# Patient Record
Sex: Female | Born: 1960 | Race: White | Hispanic: No | Marital: Married | Smoking: Never smoker
Health system: Southern US, Community
[De-identification: ages and names within clinical notes are randomized; demographics above are authoritative.]

## PROBLEM LIST (undated history)

## (undated) DIAGNOSIS — M1990 Unspecified osteoarthritis, unspecified site: Secondary | ICD-10-CM

## (undated) DIAGNOSIS — G454 Transient global amnesia: Secondary | ICD-10-CM

## (undated) DIAGNOSIS — O24419 Gestational diabetes mellitus in pregnancy, unspecified control: Secondary | ICD-10-CM

## (undated) HISTORY — PX: OTHER SURGICAL HISTORY: SHX169

## (undated) HISTORY — DX: Gestational diabetes mellitus in pregnancy, unspecified control: O24.419

## (undated) HISTORY — DX: Transient global amnesia: G45.4

## (undated) HISTORY — PX: BARTHOLIN GLAND CYST EXCISION: SHX565

## (undated) HISTORY — DX: Unspecified osteoarthritis, unspecified site: M19.90

## (undated) HISTORY — PX: TONSILLECTOMY: SUR1361

---

## 1989-11-11 HISTORY — PX: DILATATION AND CURETTAGE/HYSTEROSCOPY WITH MINERVA: SHX6851

## 1996-11-11 HISTORY — PX: WISDOM TOOTH EXTRACTION: SHX21

## 1998-02-21 ENCOUNTER — Encounter: Admission: RE | Admit: 1998-02-21 | Discharge: 1998-05-22 | Payer: Self-pay | Admitting: Obstetrics and Gynecology

## 1998-04-18 ENCOUNTER — Inpatient Hospital Stay (HOSPITAL_COMMUNITY): Admission: AD | Admit: 1998-04-18 | Discharge: 1998-04-20 | Payer: Self-pay | Admitting: Obstetrics and Gynecology

## 1998-04-24 ENCOUNTER — Encounter (HOSPITAL_COMMUNITY): Admission: RE | Admit: 1998-04-24 | Discharge: 1998-07-23 | Payer: Self-pay | Admitting: Obstetrics and Gynecology

## 1999-09-21 ENCOUNTER — Other Ambulatory Visit: Admission: RE | Admit: 1999-09-21 | Discharge: 1999-09-21 | Payer: Self-pay | Admitting: Obstetrics and Gynecology

## 2001-05-05 ENCOUNTER — Other Ambulatory Visit: Admission: RE | Admit: 2001-05-05 | Discharge: 2001-05-05 | Payer: Self-pay | Admitting: Obstetrics and Gynecology

## 2003-01-05 ENCOUNTER — Other Ambulatory Visit: Admission: RE | Admit: 2003-01-05 | Discharge: 2003-01-05 | Payer: Self-pay | Admitting: Obstetrics and Gynecology

## 2003-11-10 ENCOUNTER — Ambulatory Visit (HOSPITAL_COMMUNITY): Admission: RE | Admit: 2003-11-10 | Discharge: 2003-11-10 | Payer: Self-pay | Admitting: Urology

## 2003-11-12 HISTORY — PX: INCONTINENCE SURGERY: SHX676

## 2004-04-16 ENCOUNTER — Other Ambulatory Visit: Admission: RE | Admit: 2004-04-16 | Discharge: 2004-04-16 | Payer: Self-pay | Admitting: Obstetrics and Gynecology

## 2004-12-25 ENCOUNTER — Other Ambulatory Visit: Admission: RE | Admit: 2004-12-25 | Discharge: 2004-12-25 | Payer: Self-pay | Admitting: Obstetrics and Gynecology

## 2005-09-25 ENCOUNTER — Other Ambulatory Visit: Admission: RE | Admit: 2005-09-25 | Discharge: 2005-09-25 | Payer: Self-pay | Admitting: Obstetrics and Gynecology

## 2008-10-21 ENCOUNTER — Ambulatory Visit (HOSPITAL_COMMUNITY): Admission: RE | Admit: 2008-10-21 | Discharge: 2008-10-21 | Payer: Self-pay | Admitting: Obstetrics and Gynecology

## 2008-10-21 ENCOUNTER — Encounter (INDEPENDENT_AMBULATORY_CARE_PROVIDER_SITE_OTHER): Payer: Self-pay | Admitting: Obstetrics and Gynecology

## 2011-03-26 NOTE — Op Note (Signed)
NAME:  Michelle Lynch, Michelle Lynch                ACCOUNT NO.:  [id number]   MEDICAL RECORD NO.:  [id number]          PATIENT TYPE:  AMB   LOCATION:  SDC                           FACILITY:  [facility]   PHYSICIAN:  Leighton Roach Meisinger, M.D.DATE OF BIRTH:  01-27-1961   DATE OF PROCEDURE:  DATE OF DISCHARGE:                               OPERATIVE REPORT   PREOPERATIVE AND POSTOPERATIVE DIAGNOSES:  Endometrial lesion and stress  urinary incontinence.   PROCEDURE:  Hysteroscopy with resection of endometrial lesion followed  by NovaSure endometrial ablation and Solyx suburethral sling.   SURGEON:  Zenaida Niece, MD   ANESTHESIA:  Monitored anesthesia care and local block.   FINDINGS:  She had a large endometrial lesion arising from the left mid  fundus with a wide stalk.  She had an otherwise normal endometrial  cavity.   SPECIMENS:  Endometrial resection sent for routine Pathology.   ESTIMATED BLOOD LOSS:  100 mL.   FLUID DEFICIT THROUGH THE HYSTEROSCOPE:  235 mL most of which I believe  was on the floor.   COMPLICATIONS:  None.   PROCEDURE IN DETAIL:  The patient was taken to the operating room and  placed in the dorsal supine position.  She was given IV sedation and  placed in mobile stirrups.  Perineum and vagina were then prepped and  draped in the usual sterile fashion and bladder drained with a red  Robinson catheter.  A Graves speculum was inserted into the vagina and  the anterior lip of the cervix was grasped with a single-tooth  tenaculum.  Paracervical block was then performed with a total of 16 mL  of a mixture of 0.5% Marcaine with epinephrine and 1% lidocaine.  This  was a deep paracervical block.  The uterus then sounded to 8 cm.  The  cervix gradually easily dilated to accommodate a size 19 dilator.  The  observer hysteroscope was inserted and good visualization was achieved.  The large lesion with a wide stalk was arising from the left mid fundus  in the endometrial cavity.   The observer hysteroscope was removed.  The  resectoscope was put together and flushed with sorbitol.  The  resectoscope was then inserted.  Good visualization was achieved.  The  lesion was freed from the uterine sidewall.  This was done using a  single loop electrode with cutting current.  Once it was freed, the  hysteroscope was removed.  Small ring forceps were eventually used to  reach in and grab the polyp and remove it mostly intact.  Other small  pieces were grasped with polyp forceps.  The resectoscope was again  inserted and most of the lesion was found to be gone.  A small amount of  bumpiness on the endometrium was resected and the resectoscope was then  removed.  The observer hysteroscope was again put back together and  reinserted.  This was done after it was flushed with lactated Ringer's  and the uterus was flushed with lactated Ringer's for approximately 2  minutes.  The cavity was normal with no lesions and no bleeding.  The  hysteroscope was then removed.  The cervix then measured 3 cm with a  size 7 Hegar dilator.  The cervix dilated to a size 7 and then size 8  Hegar dilator.  The NovaSure device was then inserted and appropriately  deployed.  The device used a length of 5 cm, a width of 4.2 cm, and used  116 watts for 50 seconds.  Endometrial ablation was performed without  complications after the CO2 test was passed.  The NovaSure device was  then removed and found to be intact.  The single-tooth tenaculum was  removed and bleeding controlled with pressure.  Graves speculum was then  removed from the vagina.   Attention was turned to the sling.  A small weighted speculum was placed  into the vagina.  The anterior vagina was grasped with Allis clamps at a  distance approximately one fingerbreadth from the urethral meatus.  The  same local anesthetic was infiltrated in the midline and laterally for  hydrodissection.  A 1-cm vertical incision was then made between  the  Allis clamps.  The edges were grasped with Allis clamps and Metzenbaum  scissors were used to dissect laterally to the level of the pubic ramus  on both sides.  The Solyx sling was then put together and inserted first  on the patient's right side just past the pubic ramus into the obturator  muscle.  It was released on the right side and then placed on the left  side and left intact.  It appeared to be snug up against the urethra.  I  was able to pass a hemostat between the sling and the urethral tissue,  but it was snug.  Cystoscopy was then performed and revealed a normal  bladder and no evidence of sling in the bladder or the urethra.  The  cystoscope was then removed.  A Crede maneuver was performed and no  urine leakage was observed.  The sling was again inspected and found to  be in appropriate position.  The sling was released on the patient's  left side.  No evidence of buttonholing in the vagina was evident.  The  incision was then closed with running locking 2-0 Vicryl.  This achieved  adequate closure and adequate hemostasis.  All instruments were then  removed from the vagina.  The bladder was drained of all but  approximately 100 mL of fluid.  The patient was then taken down from  stirrups.  She tolerated the procedure well and was taken to the  recovery room in stable condition.  Counts were correct, she had PAS  hose on throughout the procedure and received Ancef 1 g IV at the  beginning of the procedure.      Zenaida Niece, M.D.  Electronically Signed     TDM/MEDQ  D:  10/21/2008  T:  10/22/2008  Job:  [id number]

## 2011-08-16 LAB — PREGNANCY, URINE: Preg Test, Ur: NEGATIVE

## 2011-08-16 LAB — CBC
HCT: 37.7 % (ref 36.0–46.0)
Hemoglobin: 12.5 g/dL (ref 12.0–15.0)
MCHC: 33.2 g/dL (ref 30.0–36.0)
MCV: 92.9 fL (ref 78.0–100.0)
Platelets: 264 10*3/uL (ref 150–400)
RBC: 4.05 MIL/uL (ref 3.87–5.11)
RDW: 13.9 % (ref 11.5–15.5)
WBC: 5.3 10*3/uL (ref 4.0–10.5)

## 2016-12-09 DIAGNOSIS — Z1231 Encounter for screening mammogram for malignant neoplasm of breast: Secondary | ICD-10-CM | POA: Diagnosis not present

## 2017-06-04 ENCOUNTER — Other Ambulatory Visit (HOSPITAL_COMMUNITY): Payer: Self-pay

## 2017-06-04 ENCOUNTER — Observation Stay (HOSPITAL_COMMUNITY): Payer: BLUE CROSS/BLUE SHIELD

## 2017-06-04 ENCOUNTER — Observation Stay (HOSPITAL_COMMUNITY)
Admission: EM | Admit: 2017-06-04 | Discharge: 2017-06-05 | Disposition: A | Discharge status: Home / Self Care | Payer: BLUE CROSS/BLUE SHIELD | Attending: Family Medicine | Admitting: Family Medicine

## 2017-06-04 ENCOUNTER — Encounter (HOSPITAL_COMMUNITY): Payer: Self-pay

## 2017-06-04 ENCOUNTER — Emergency Department (HOSPITAL_COMMUNITY): Payer: BLUE CROSS/BLUE SHIELD

## 2017-06-04 ENCOUNTER — Other Ambulatory Visit: Payer: Self-pay

## 2017-06-04 DIAGNOSIS — E663 Overweight: Secondary | ICD-10-CM | POA: Diagnosis not present

## 2017-06-04 DIAGNOSIS — K0381 Cracked tooth: Secondary | ICD-10-CM | POA: Diagnosis not present

## 2017-06-04 DIAGNOSIS — K0889 Other specified disorders of teeth and supporting structures: Secondary | ICD-10-CM

## 2017-06-04 DIAGNOSIS — F411 Generalized anxiety disorder: Secondary | ICD-10-CM | POA: Insufficient documentation

## 2017-06-04 DIAGNOSIS — R29818 Other symptoms and signs involving the nervous system: Secondary | ICD-10-CM | POA: Diagnosis not present

## 2017-06-04 DIAGNOSIS — F430 Acute stress reaction: Secondary | ICD-10-CM | POA: Diagnosis not present

## 2017-06-04 DIAGNOSIS — Z634 Disappearance and death of family member: Secondary | ICD-10-CM | POA: Insufficient documentation

## 2017-06-04 DIAGNOSIS — G454 Transient global amnesia: Principal | ICD-10-CM | POA: Insufficient documentation

## 2017-06-04 DIAGNOSIS — R410 Disorientation, unspecified: Secondary | ICD-10-CM | POA: Diagnosis not present

## 2017-06-04 LAB — I-STAT CHEM 8, ED
BUN: 9 mg/dL (ref 6–20)
CALCIUM ION: 1.19 mmol/L (ref 1.15–1.40)
CHLORIDE: 105 mmol/L (ref 101–111)
CREATININE: 0.6 mg/dL (ref 0.44–1.00)
GLUCOSE: 102 mg/dL — AB (ref 65–99)
HCT: 40 % (ref 36.0–46.0)
Hemoglobin: 13.6 g/dL (ref 12.0–15.0)
POTASSIUM: 3.4 mmol/L — AB (ref 3.5–5.1)
Sodium: 143 mmol/L (ref 135–145)
TCO2: 26 mmol/L (ref 0–100)

## 2017-06-04 LAB — DIFFERENTIAL
Basophils Absolute: 0 10*3/uL (ref 0.0–0.1)
Basophils Relative: 0 %
Eosinophils Absolute: 0.1 10*3/uL (ref 0.0–0.7)
Eosinophils Relative: 1 %
Lymphocytes Relative: 38 %
Lymphs Abs: 2.3 10*3/uL (ref 0.7–4.0)
Monocytes Absolute: 0.3 10*3/uL (ref 0.1–1.0)
Monocytes Relative: 5 %
Neutro Abs: 3.4 10*3/uL (ref 1.7–7.7)
Neutrophils Relative %: 56 %

## 2017-06-04 LAB — COMPREHENSIVE METABOLIC PANEL
ALT: 11 U/L — ABNORMAL LOW (ref 14–54)
AST: 23 U/L (ref 15–41)
Albumin: 4.4 g/dL (ref 3.5–5.0)
Alkaline Phosphatase: 75 U/L (ref 38–126)
Anion gap: 9 (ref 5–15)
BUN: 9 mg/dL (ref 6–20)
CO2: 26 mmol/L (ref 22–32)
Calcium: 9.4 mg/dL (ref 8.9–10.3)
Chloride: 106 mmol/L (ref 101–111)
Creatinine, Ser: 0.68 mg/dL (ref 0.44–1.00)
GFR calc Af Amer: >60 mL/min (ref >60)
GFR calc non Af Amer: >60 mL/min (ref >60)
Glucose, Bld: 104 mg/dL — ABNORMAL HIGH (ref 65–99)
Potassium: 3.4 mmol/L — ABNORMAL LOW (ref 3.5–5.1)
Sodium: 141 mmol/L (ref 135–145)
Total Bilirubin: 0.6 mg/dL (ref 0.3–1.2)
Total Protein: 7.2 g/dL (ref 6.5–8.1)

## 2017-06-04 LAB — PROTIME-INR
INR: 0.88
Prothrombin Time: 11.9 seconds (ref 11.4–15.2)

## 2017-06-04 LAB — APTT: aPTT: 27 seconds (ref 24–36)

## 2017-06-04 LAB — CBC
HCT: 40 % (ref 36.0–46.0)
Hemoglobin: 13.1 g/dL (ref 12.0–15.0)
MCH: 29.6 pg (ref 26.0–34.0)
MCHC: 32.8 g/dL (ref 30.0–36.0)
MCV: 90.5 fL (ref 78.0–100.0)
Platelets: 255 10*3/uL (ref 150–400)
RBC: 4.42 MIL/uL (ref 3.87–5.11)
RDW: 14.2 % (ref 11.5–15.5)
WBC: 6.1 10*3/uL (ref 4.0–10.5)

## 2017-06-04 LAB — I-STAT TROPONIN, ED: Troponin i, poc: 0 ng/mL (ref 0.00–0.08)

## 2017-06-04 MED ORDER — ACETAMINOPHEN 325 MG PO TABS
650.0000 mg | ORAL_TABLET | Freq: Four times a day (QID) | ORAL | Status: DC | PRN
  Administered 2017-06-05 (×2): 650 mg via ORAL
  Filled 2017-06-04 (×2): qty 2

## 2017-06-04 MED ORDER — ACETAMINOPHEN 650 MG RE SUPP: 650.0000 mg | Freq: Four times a day (QID) | RECTAL | Status: DC | PRN

## 2017-06-04 MED ORDER — THIAMINE HCL 100 MG/ML IJ SOLN
100.0000 mg | Freq: Every day | INTRAMUSCULAR | Status: DC
  Filled 2017-06-04: qty 2

## 2017-06-04 MED ORDER — TRAMADOL HCL 50 MG PO TABS
50.0000 mg | ORAL_TABLET | Freq: Four times a day (QID) | ORAL | Status: DC | PRN
  Administered 2017-06-04 – 2017-06-05 (×2): 50 mg via ORAL
  Filled 2017-06-04 (×2): qty 1

## 2017-06-04 MED ORDER — ACETAMINOPHEN 325 MG PO TABS
650.0000 mg | ORAL_TABLET | Freq: Once | ORAL | Status: AC
  Administered 2017-06-04: 650 mg via ORAL
  Filled 2017-06-04: qty 2

## 2017-06-04 MED ORDER — ENOXAPARIN SODIUM 40 MG/0.4ML ~~LOC~~ SOLN
40.0000 mg | SUBCUTANEOUS | Status: DC
  Filled 2017-06-04: qty 0.4

## 2017-06-04 NOTE — Consult Note (Signed)
Neurology Consultation Reason for Consult: Acute code stroke Referring Physician: Dr. Clarice PolePfeifer  CC: Confusion  History is obtained from: Patient, chart, nurses and patient's 2 daughters at bedside  HPI: Michelle Lynch is a 56 y.o. female who has no known past medical history who was brought into the hospital when her daughters found her to be confused-reported as asking the same questions over and over again and not remembering what happened during the day. This early afternoon, the patient had gone to a dentist for an appointment, had been told that she will need to come back for another appointment for an implant, no procedure was done no anesthetic was used. Patient came back home and could not remember what had happened. She kept on saying my teeth hurt and was disoriented to place and time. She said it seems like she is in a dream and does not know where she is and what is going on. She kept asking, if this could be a panic attack. She denied any history of panic attacks in the past. According to the daughters, she has multiple stressors, mainly monetary but has never had these kind of episodes before Denied any knowledge of drug use. Complains of mild headache now. No h/o migraines in the past.   LKW: 2:30 PM tpa given?: no, NIH 0. 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    History reviewed. No pertinent past medical history.  No family history on file. Has a history of stroke in mother.   Social History:  reports that she has never smoked. She has never used smokeless tobacco. She reports that she does not drink alcohol or use drugs.   Exam: Current vital signs: BP (!) 156/74   Pulse 75   Temp 98.6 F (37 C) (Oral)   Resp 18   Wt 77.1 kg (170 lb)   SpO2 100%  Vital signs in last 24 hours: Temp:  [98.6 F (37 C)] 98.6 F (37 C) (07/25 1758) Pulse Rate:  [75] 75 (07/25  1758) Resp:  [18] 18 (07/25 1758) BP: (156)/(74) 156/74 (07/25 1758) SpO2:  [100 %] 100 % (07/25 1758) Weight:  [77.1 kg (170 lb)] 77.1 kg (170 lb) (07/25 1759)   Physical Exam  Constitutional: Appears well-developed and well-nourished.  Psych: Affect appropriate to situation Eyes: No scleral injection HENT: No OP obstrucion Head: Normocephalic.  Cardiovascular: Normal rate and regular rhythm.  Respiratory: Effort normal and breath sounds normal to anterior ascultation GI: Soft.  No distension. There is no tenderness.  Skin: WDI  Neuro: Mental Status: Patient is awake, alert, oriented to person and place. Not oriented to date or month. Perseverating and repeating the same questions over and over again-"can somebody fill me in on on what's going on, my teeth hurt" She is able to provide a reasonably clear history of events before this afternoon but does not remember much from this afternoon. Naming comprehension and repetition intact. Speech is not dysarthric Cranial Nerves: II: Visual Fields are full. Pupils are equal, round, and reactive to light.  III,IV, VI: EOMI without ptosis or diploplia.  V: Facial sensation is symmetric to temperature VII: Facial movement is symmetric.  VIII: hearing is intact to voice X: Uvula elevates symmetrically XI: Shoulder shrug is symmetric. XII: tongue is midline without atrophy or fasciculations.  Motor: Tone is normal. Bulk is normal. 5/5 strength was present in  all four extremities. Sensory: Sensation is symmetric to light touch and temperature in the arms and legs Deep Tendon Reflexes: 2+ and symmetric in the biceps and patellae Plantars: Toes are downgoing bilaterally. Cerebellar: FNF and HKS are intact bilaterall   I have reviewed labs in epic and the results pertinent to this consultation are: CBC    Component Value Date/Time   WBC 6.1 06/04/2017 1758   RBC 4.42 06/04/2017 1758   HGB 13.6 06/04/2017 1821   HCT 40.0 06/04/2017  1821   PLT 255 06/04/2017 1758   MCV 90.5 06/04/2017 1758   MCH 29.6 06/04/2017 1758   MCHC 32.8 06/04/2017 1758   RDW 14.2 06/04/2017 1758   LYMPHSABS 2.3 06/04/2017 1758   MONOABS 0.3 06/04/2017 1758   EOSABS 0.1 06/04/2017 1758   BASOSABS 0.0 06/04/2017 1758   CMP     Component Value Date/Time   NA 143 06/04/2017 1821   K 3.4 (L) 06/04/2017 1821   CL 105 06/04/2017 1821   GLUCOSE 102 (H) 06/04/2017 1821   BUN 9 06/04/2017 1821   CREATININE 0.60 06/04/2017 1821    Imaging I have reviewed the images obtained: Noncontrast CT of the head shows no acute changes. No bleed. No signs of evolving ischemic changes.  Impression: 56 year old with no past medical history with sudden onset of what was described as confusion that included not being aware of where she was and what was going on at the moment. She had lost recollection of events before this afternoon. According to the daughters, she had to undergo a root canal treatment, which was canceled and she was scheduled for an implant which is a very expensive procedures and she has been stressed out about money for a while. She herself asked a number of times if she was having a panic attack. She has no focal neurological deficits in terms of her cranial nerves, motor function sensory function coordination or gait.  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 I would like to obtain some imaging to ensure that there is no stroke.  Differentials in order of likelihood: -Transient global amnesia (TGA) -Panic attack -r/o stroke -very unlikely seizure -unlikely complicated migraine  Recommendations -MRI of the brain without contrast -Admit for observation overnight. If imaging is normal, can be discharged home from a neurological standpoint tomorrow. -Given the odd presentation as well as slightly younger age for a TGA, I would also recommend obtaining a urine drug screen. -Outpatient EEG -Outpatient neurology follow-up  The  plan was discussed with the daughters at bedside and the patient. We will follow in the morning  -- Milon DikesAshish Ailani Governale, MD [facility] [phone]  If 7pm to 7am, please call on call as listed on AMION.

## 2017-06-04 NOTE — ED Provider Notes (Signed)
[facility]-EMERGENCY DEPT Provider Note   CSN: [id number] Arrival date & time: 06/04/17  1740   An emergency department physician performed an initial assessment on this suspected stroke patient at 1814.  History   Chief Complaint Chief Complaint  Patient presents with  . Code Stroke    HPI Michelle Lynch is a 56 y.o. female.  HPI   Patient is a 56 year old female who went to the dentist today. She got some bad news about her teeth that one would have to be pulled and she have to get implant. She returned home and could not remember who she was where she had gone that day or needing that happened. Patient continues to ask the same question over and over again. Patient initially called a code stroke.  History reviewed. No pertinent past medical history.  There are no active problems to display for this patient.   History reviewed. No pertinent surgical history.  OB History    No data available       Home Medications    Prior to Admission medications   Not on File    Family History No family history on file.  Social History Social History  Substance Use Topics  . Smoking status: Never Smoker  . Smokeless tobacco: Never Used  . Alcohol use No     Allergies   Patient has no known allergies.   Review of Systems Review of Systems  HENT: Positive for dental problem.   Psychiatric/Behavioral: Positive for confusion. The patient is nervous/anxious.      Physical Exam Updated Vital Signs BP (!) 148/66   Pulse 72   Temp 98.6 F (37 C) (Oral)   Resp 20   Wt 77.1 kg (170 lb)   SpO2 100%   Physical Exam  Constitutional: She appears well-developed and well-nourished.  HENT:  Head: Normocephalic and atraumatic.  Eyes: Right eye exhibits no discharge.  Cardiovascular: Normal rate, regular rhythm and normal heart sounds.   No murmur heard. Pulmonary/Chest: Effort normal and breath sounds normal. She has no wheezes. She has no rales.  Abdominal: Soft. She  exhibits no distension. There is no tenderness.  Neurological: No cranial nerve deficit.  Patient continues ask the same question over and over again. No focal neuro deficits.  Skin: Skin is warm and dry. She is not diaphoretic.  Psychiatric: She has a normal mood and affect.  Nursing note and vitals reviewed.    ED Treatments / Results  Labs (all labs ordered are listed, but only abnormal results are displayed) Labs Reviewed  I-STAT CHEM 8, ED - Abnormal; Notable for the following:       Result Value   Potassium 3.4 (*)    Glucose, Bld 102 (*)    All other components within normal limits  CBC  PROTIME-INR  APTT  DIFFERENTIAL  COMPREHENSIVE METABOLIC PANEL  I-STAT TROPONIN, ED  CBG MONITORING, ED  CBG MONITORING, ED    EKG  EKG Interpretation None       Radiology Ct Head Code Stroke W/o Cm  Result Date: 06/04/2017 CLINICAL DATA:  Code stroke. Initial evaluation for acute confusion, jaw pain. EXAM: CT HEAD WITHOUT CONTRAST TECHNIQUE: Contiguous axial images were obtained from the base of the skull through the vertex without intravenous contrast. COMPARISON:  None. FINDINGS: Brain: Cerebral volume within normal limits. No acute intracranial hemorrhage. No evidence for acute large vessel territory infarct. No mass lesion, midline shift or mass effect. No hydrocephalus. No extra-axial fluid collection. Probable small choroidal  fissure cyst noted on the right. Vascular: No hyperdense vessel. Skull: Scalp soft tissues and calvarium within normal limits. Sinuses/Orbits: Globes and orbital soft tissues within normal limits. Minimal layering opacity within the right maxillary sinus. Paranasal sinuses are otherwise clear. No mastoid effusion. The ASPECTS [facility]([place] Stroke Program Early CT Score) - Ganglionic level infarction (caudate, lentiform nuclei, internal capsule, insula, M1-M3 cortex): 7 - Supraganglionic infarction (M4-M6 cortex): 3 Total score (0-10 with 10 being normal): 10  IMPRESSION: 1. Negative head CT.  No acute intracranial process identified. 2. ASPECTS is 10 Critical Value/emergent results were called by telephone at the time of interpretation on 06/04/2017 at 6:35 pm to Dr. Wilford CornerArora , who verbally acknowledged these results. Electronically Signed   By: Rise MuBenjamin  McClintock M.D.   On: 06/04/2017 18:37    Procedures Procedures (including critical care time)  Medications Ordered in ED Medications - No data to display   Initial Impression / Assessment and Plan / ED Course  I have reviewed the triage vital signs and the nursing notes.  Pertinent labs & imaging results that were available during my care of the patient were reviewed by me and considered in my medical decision making (see chart for details).     Patient is a 56 year old female who went to the dentist today. She got some bad news about her teeth that one would have to be pulled and she have to get implant. She returned home and could not remember who she was where she had gone that day or needing that happened. Patient continues to ask the same question over and over again. Patient initially called a code stroke.  Classic symptoms of a TGA. Neuro seen and agrees. Will admit to medicine for overnight observation, MRI.  Final Clinical Impressions(s) / ED Diagnoses   Final diagnoses:  None    New Prescriptions New Prescriptions   No medications on file     Abelino DerrickMackuen, [place] Figg Lyn, MD 06/04/17 1902

## 2017-06-04 NOTE — H&P (Signed)
Family Medicine Teaching [facility] Admission History and Physical Service Pager: [phone]  Patient name: Michelle Lynch Medical record number: [id number] Date of birth: July 31, 1961 Age: 56 y.o. Gender: female  Primary Care Provider: Patient, No Pcp Per Consultants: Neurology Code Status: Full  Chief Complaint: memory loss  Assessment and Plan: Michelle Lynch is a 56 y.o. female presenting with new onset amnesia. No significant PMH.   #Confusion: New onset amnesia that began this afternoon. Neurology consulting and suspects transient global amnesia. Patient's younger age less typical, but her prominent anterograde and limited retrograde amnesia are very consistent with TGA. Possible triggers include pain of broken tooth and stress over being told she needs to have an expensive dental procedure in setting of financial concerns. No evidence of trauma on exam. CT head negative. Continues to know biographical information, which would make psychogenic amnesia less likely. Negative history for previous stroke, HTN, smoking and no focal deficits making acute stroke less likely, though could have hippocampal lesion that could explain symptoms. Normal EKG. No leukocytosis. No electrolyte derangements on BMP. Denies substance abuse. Patient reports remote history of 2 previous migraines. Does not recall having extreme stress responses in past requiring medical treatment, nor do her daughters. No personal or family history of epilepsy.  - Admit for observation, attending Dr. Lum Babe - Neurology consulting, appreciate recommendations: Obtain MRI brain w/o contrast; UDS; Outpatient EEG and Neurology follow-up - Administer IV thiamine 100 mg in case of Wernicke's encephalopathy - Q4 hour Neuro checks - Would obtain stroke risk factor labs if MRI shows evidence of acute abnormality - Tylenol prn for pain; tramadol 50 mg prn for more severe pain  FEN/GI: Regular diet Prophylaxis: Lovenox  Disposition:  Home pending clinical improvement and further neuroimaging  History of Present Illness:  Michelle Lynch is a 56 y.o. female presenting with new onset confusion, specifically memory loss. She recalls past events up until a couple days ago but now repeats herself and remembers few details from today. Her daughters report she went to check on a rental property this morning then was seen by her dentist. She was told she has a cracked tooth and would need to have a root canal and went to see an Endodontist around 3 PM. No procedure was performed and no medications given; she was scheduled to be seen tomorrow for procedure. She drove home and texted one of her daughters around 4:50 pm, saying she needed her help. Daughter says she was not oriented to time or place and kept talking about her teeth hurting. Patient remembers visiting her niece and her new baby on Sunday. She does not remember events from Tuesday or today but has started to repeat a few things about the dentist, which daughter attributes to them having reminded her so many times. Stressors include a problem with one of patient's rental properties and news that patient needs a dental procedure. Patient currently complaining of dental pain and saying that she must have had an acute stress reaction. She repeats these things every few minutes.   Code stroke was initially called in the ED. CT head was negative for acute intracranial abnormality.  Review Of Systems: Per HPI with the following additions:   Review of Systems  Constitutional: Negative for chills, fever and weight loss.  HENT: Negative for congestion and tinnitus.   Eyes: Negative for blurred vision and double vision.  Respiratory: Negative for cough and shortness of breath.   Cardiovascular: Negative for chest pain and palpitations.  Gastrointestinal: Negative for nausea and vomiting.  Genitourinary: Negative for dysuria and frequency.  Musculoskeletal: Negative for falls and neck  pain.  Skin: Negative for itching and rash.  Neurological: Negative for speech change, weakness and headaches.  Psychiatric/Behavioral: Positive for memory loss. The patient is nervous/anxious.     Past Medical History: History reviewed. No pertinent past medical history.  Had gestational diabetes. Takes no regular medication.   Past Surgical History: History reviewed. No pertinent surgical history.  Had a D&C in past for miscarriage. Had endometrial ablation and suburethral ring placed for endometrial lesion and stress urinary incontinence in 2012. Had tonsillectomy when she was 5.  Social History: Social History  Substance Use Topics  . Smoking status: Never Smoker  . Smokeless tobacco: Never Used  . Alcohol use No   Additional social history: Lives with her husband and 2 daughters. Occasionally drinks. No drug or tobacco abuse. Manages rental properties.  Please also refer to relevant sections of EMR.  Family History: No family history on file. Mother had T2DM and stroke.   Allergies and Medications: No Known Allergies  Objective: BP 126/71   Pulse 68   Temp 98.6 F (37 C)   Resp 14   Wt 170 lb (77.1 kg)   SpO2 98%  Exam: General: Alert, overweight female in NAD Eyes: PERRL, EOMI ENTM: MMM, normal external ears Neck: Supple, FROM Cardiovascular: RRR, S1, S2, no mrg Respiratory: CTAB, no increased WOB. Gastrointestinal: +BS, soft, NT, ND MSK: Strength 5/5 of upper and lower extremities, including grip strength. Derm: No rashes noted. Thicker arm across forearms.  Neuro: CNII-XII. Speech fluent. Oriented to self. Knew month and year but not day of week.  Psych: Somewhat anxious. Perseverating on having to have dental procedure.   Labs and Imaging: CBC BMET   Recent Labs Lab 06/04/17 1758 06/04/17 1821  WBC 6.1  --   HGB 13.1 13.6  HCT 40.0 40.0  PLT 255  --     Recent Labs Lab 06/04/17 1758 06/04/17 1821  NA 141 143  K 3.4* 3.4*  CL 106 105   CO2 26  --   BUN 9 9  CREATININE 0.68 0.60  GLUCOSE 104* 102*  CALCIUM 9.4  --      Ct Head Code Stroke W/o Cm  Result Date: 06/04/2017 CLINICAL DATA:  Code stroke. Initial evaluation for acute confusion, jaw pain. EXAM: CT HEAD WITHOUT CONTRAST TECHNIQUE: Contiguous axial images were obtained from the base of the skull through the vertex without intravenous contrast. COMPARISON:  None. FINDINGS: Brain: Cerebral volume within normal limits. No acute intracranial hemorrhage. No evidence for acute large vessel territory infarct. No mass lesion, midline shift or mass effect. No hydrocephalus. No extra-axial fluid collection. Probable small choroidal fissure cyst noted on the right. Vascular: No hyperdense vessel. Skull: Scalp soft tissues and calvarium within normal limits. Sinuses/Orbits: Globes and orbital soft tissues within normal limits. Minimal layering opacity within the right maxillary sinus. Paranasal sinuses are otherwise clear. No mastoid effusion. The ASPECTS [facility](Alberta Stroke Program Early CT Score) - Ganglionic level infarction (caudate, lentiform nuclei, internal capsule, insula, M1-M3 cortex): 7 - Supraganglionic infarction (M4-M6 cortex): 3 Total score (0-10 with 10 being normal): 10 IMPRESSION: 1. Negative head CT.  No acute intracranial process identified. 2. ASPECTS is 10 Critical Value/emergent results were called by telephone at the time of interpretation on 06/04/2017 at 6:35 pm to Dr. Wilford CornerArora , who verbally acknowledged these results. Electronically Signed   By: Rise MuBenjamin  McClintock  M.D.   On: 06/04/2017 18:37    Casey BurkittFitzgerald, Quamir Willemsen Moen, MD 06/04/2017, 8:28 PM PGY-3, [facility] FPTS Intern pager: [phone], text pages welcome

## 2017-06-04 NOTE — Progress Notes (Signed)
Family Medicine Teaching Service Daily Progress Note Intern Pager: [phone]  Patient name: Michelle Lynch Medical record number: [id number] Date of birth: 10/27/1961 Age: 56 y.o. Gender: female  Primary Care Provider: Patient, No Pcp Per Consultants: Neurology  Code Status: Full   Pt Overview and Major Events to Date:  Michelle Lynch is a 56 y.o. female presenting with new onset amnesia.   Assessment and Plan: Michelle Lynch is a 56 y.o. female presenting with new onset amnesia. No significant PMH.   Confusion:  New onset amnesia that began yesterday afternoon. 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 Have recommended MRI with out contrast, UDS, and outpatient EEG and follow up. Patient's younger age less typical, but her prominent anterograde and limited retrograde amnesia are very consistent with TGA. Possible triggers include pain of broken tooth and stress over being told she needs to have an expensive dental procedure in setting of financial concerns. Patient appears to have generalized anxiety on exam. No evidence of trauma on exam. CT head negative. Continues to know biographical information, which would make psychogenic amnesia less likely. Negative history for previous stroke, HTN, smoking and no focal deficits making acute stroke less likely, though could have hippocampal lesion that could explain symptoms. EKG showing normal sinus rhythm. Unlikely to be due to seizure, but will plan for outpatient EEG with neurology. Normal EKG. No leukocytosis. No electrolyte derangements on BMP. Denies substance abuse. Patient reports remote history of 2 previous migraines. Does not recall having extreme stress responses in past requiring medical treatment, nor do her daughters. No personal or family history of epilepsy. Patient refused MRI while in ED, but is agreable today. UDS negative. Patient states she is improving and memory is coming back. States she remembers her morning  routine prior to dental appointment but does not remember actual appointment, states she may have blocked it out as a traumatic event. MRI showing asymmetric prominence of right choroidal fissure without definite features of mesial temporal sclerosis.  - Neurology consulting, appreciate recommendations - EEG - Administer IV thiamine 100 mg in case of Wernicke's encephalopathy - Q4 hour Neuro checks - Would obtain stroke risk factor labs if MRI shows evidence of acute abnormality - Tylenol prn for pain; tramadol 50 mg prn for more severe pain - consider outpatient SSRI for anxiety   FEN/GI: Regular diet  PPx: Lovenox   Disposition: home  Subjective:  Patient today states she is improved and memory is coming back. Patient remembers morning routine and getting ready for dental appointment, but does not remember appointment. Patient states she blocked it out as a "traumatic event" and is very anxious about dental visit. Patient was very tearful on exam and states hospitalization is bringing back memories of her mother, who recently passed away from a stroke. Patient states she has been under increased stress due to mother's death, and other financial burdens. Patient denies any type of physical exertion or valsalva prior to amnesia episode.   Objective: Temp:  [97.9 F (36.6 C)-98.6 F (37 C)] 98.1 F (36.7 C) (07/26 1009) Pulse Rate:  [66-76] 66 (07/26 1009) Resp:  [14-25] 19 (07/26 1009) BP: (105-156)/(53-78) 105/59 (07/26 1009) SpO2:  [97 %-100 %] 97 % (07/26 1009) Weight:  [170 lb (77.1 kg)-179 lb 12.8 oz (81.6 kg)] 179 lb 12.8 oz (81.6 kg) (07/25 2318) Physical Exam: General: awake and alert, anxious, sitting up in bed watching tv Neuro: unable to complete due to patient becoming tearful and not wanting to  finish exam, sensation intact bilaterally Eyes: EOMI Cardiovascular: RRR, no MRG Respiratory: CTAB, no wheezes, rales, or rhonchi  Abdomen: soft, non tender, non  distended Extremities: no edema, non tender  Laboratory:  Recent Labs Lab 06/04/17 1758 06/04/17 1821 06/05/17 0419  WBC 6.1  --  6.0  HGB 13.1 13.6 12.4  HCT 40.0 40.0 37.5  PLT 255  --  240    Recent Labs Lab 06/04/17 1758 06/04/17 1821 06/05/17 0419  NA 141 143 139  K 3.4* 3.4* 3.7  CL 106 105 105  CO2 26  --  27  BUN 9 9 7   CREATININE 0.68 0.60 0.66  CALCIUM 9.4  --  9.0  PROT 7.2  --   --   BILITOT 0.6  --   --   ALKPHOS 75  --   --   ALT 11*  --   --   AST 23  --   --   GLUCOSE 104* 102* 104*   Drugs of Abuse     Component Value Date/Time   LABOPIA NONE DETECTED 06/04/2017 2353   COCAINSCRNUR NONE DETECTED 06/04/2017 2353   LABBENZ NONE DETECTED 06/04/2017 2353   AMPHETMU NONE DETECTED 06/04/2017 2353   THCU NONE DETECTED 06/04/2017 2353   LABBARB NONE DETECTED 06/04/2017 2353      Imaging/Diagnostic Tests: Mr Brain Wo Contrast  Result Date: 06/05/2017 CLINICAL DATA:  Confusion.  Transient global amnesia. EXAM: MRI HEAD WITHOUT CONTRAST TECHNIQUE: Multiplanar, multiecho pulse sequences of the brain and surrounding structures were obtained without intravenous contrast. COMPARISON:  CT head 06/04/2017. FINDINGS: Brain: No evidence for acute stroke, hemorrhage, hydrocephalus, mass lesion, or extra-axial fluid. There is asymmetric prominence of the RIGHT choroidal fissure, without definite features of mesial temporal sclerosis. There may be mild hippocampal atrophy, but there is no increased T2 signal in the temporal lobe. Vascular: Normal flow voids. Skull and upper cervical spine: Normal marrow signal. Sinuses/Orbits: Negative. Other: None. IMPRESSION: Asymmetric prominence of the RIGHT choroidal fissure without definite features of mesial temporal sclerosis. Otherwise unremarkable MR brain. Electronically Signed   By: Elsie StainJohn T Curnes M.D.   On: 06/05/2017 10:35   Ct Head Code Stroke W/o Cm  Result Date: 06/04/2017 CLINICAL DATA:  Code stroke. Initial  evaluation for acute confusion, jaw pain. EXAM: CT HEAD WITHOUT CONTRAST TECHNIQUE: Contiguous axial images were obtained from the base of the skull through the vertex without intravenous contrast. COMPARISON:  None. FINDINGS: Brain: Cerebral volume within normal limits. No acute intracranial hemorrhage. No evidence for acute large vessel territory infarct. No mass lesion, midline shift or mass effect. No hydrocephalus. No extra-axial fluid collection. Probable small choroidal fissure cyst noted on the right. Vascular: No hyperdense vessel. Skull: Scalp soft tissues and calvarium within normal limits. Sinuses/Orbits: Globes and orbital soft tissues within normal limits. Minimal layering opacity within the right maxillary sinus. Paranasal sinuses are otherwise clear. No mastoid effusion. The ASPECTS [facility](Alberta Stroke Program Early CT Score) - Ganglionic level infarction (caudate, lentiform nuclei, internal capsule, insula, M1-M3 cortex): 7 - Supraganglionic infarction (M4-M6 cortex): 3 Total score (0-10 with 10 being normal): 10 IMPRESSION: 1. Negative head CT.  No acute intracranial process identified. 2. ASPECTS is 10 Critical Value/emergent results were called by telephone at the time of interpretation on 06/04/2017 at 6:35 pm to Dr. Wilford CornerArora , who verbally acknowledged these results. Electronically Signed   By: Rise MuBenjamin  McClintock M.D.   On: 06/04/2017 18:37     Oralia ManisAbraham, Sheena Donegan, DO 06/05/2017, 11:13 AM PGY-1, [facility] Intern pager: [phone], text pages welcome

## 2017-06-04 NOTE — ED Notes (Signed)
Transport here to get pt for MRI. Pt refusing to go at this time.

## 2017-06-04 NOTE — ED Notes (Signed)
Neurologist canceling code stroke but will consult

## 2017-06-04 NOTE — ED Notes (Signed)
Per dr Broadus Johnpfieffer pt is a code stroke taken straight to ct

## 2017-06-04 NOTE — ED Triage Notes (Addendum)
Pt presents to the ed with daughters. Daughters state that she was with her until [address] today and then she went to the dentist and came home confused, she does not remember being at the dentist and keeps asking repetitive questions, no weakness, facial droop or tingling. Pt is alert and oriented to self and place disoriented to time and situation. edp notified. Pt keeps stating "I feel like ive been in a really bad dream" and "my teeth hurt really bad".

## 2017-06-05 ENCOUNTER — Encounter (HOSPITAL_COMMUNITY): Payer: Self-pay

## 2017-06-05 ENCOUNTER — Observation Stay (HOSPITAL_COMMUNITY): Payer: BLUE CROSS/BLUE SHIELD

## 2017-06-05 ENCOUNTER — Observation Stay (HOSPITAL_COMMUNITY)
Admit: 2017-06-05 | Discharge: 2017-06-05 | Disposition: A | Discharge status: Home / Self Care | Payer: BLUE CROSS/BLUE SHIELD | Attending: Family Medicine | Admitting: Family Medicine

## 2017-06-05 DIAGNOSIS — G454 Transient global amnesia: Secondary | ICD-10-CM | POA: Diagnosis not present

## 2017-06-05 DIAGNOSIS — R410 Disorientation, unspecified: Secondary | ICD-10-CM | POA: Diagnosis not present

## 2017-06-05 LAB — RAPID URINE DRUG SCREEN, HOSP PERFORMED
Amphetamines: NOT DETECTED
Barbiturates: NOT DETECTED
Benzodiazepines: NOT DETECTED
COCAINE: NOT DETECTED
OPIATES: NOT DETECTED
TETRAHYDROCANNABINOL: NOT DETECTED

## 2017-06-05 LAB — BASIC METABOLIC PANEL
ANION GAP: 7 (ref 5–15)
BUN: 7 mg/dL (ref 6–20)
CALCIUM: 9 mg/dL (ref 8.9–10.3)
CO2: 27 mmol/L (ref 22–32)
CREATININE: 0.66 mg/dL (ref 0.44–1.00)
Chloride: 105 mmol/L (ref 101–111)
GLUCOSE: 104 mg/dL — AB (ref 65–99)
Potassium: 3.7 mmol/L (ref 3.5–5.1)
Sodium: 139 mmol/L (ref 135–145)

## 2017-06-05 LAB — CBC
HCT: 37.5 % (ref 36.0–46.0)
HEMOGLOBIN: 12.4 g/dL (ref 12.0–15.0)
MCH: 30.1 pg (ref 26.0–34.0)
MCHC: 33.1 g/dL (ref 30.0–36.0)
MCV: 91 fL (ref 78.0–100.0)
PLATELETS: 240 10*3/uL (ref 150–400)
RBC: 4.12 MIL/uL (ref 3.87–5.11)
RDW: 14.7 % (ref 11.5–15.5)
WBC: 6 10*3/uL (ref 4.0–10.5)

## 2017-06-05 LAB — HIV ANTIBODY (ROUTINE TESTING W REFLEX): HIV SCREEN 4TH GENERATION: NONREACTIVE

## 2017-06-05 MED ORDER — LORAZEPAM 0.5 MG PO TABS: 0.5000 mg | ORAL_TABLET | Freq: Once | ORAL | Status: DC | PRN

## 2017-06-05 MED ORDER — LORAZEPAM 0.5 MG PO TABS: 0.5000 mg | ORAL_TABLET | ORAL | 0 refills | Status: AC | PRN

## 2017-06-05 NOTE — Discharge Summary (Signed)
Family Medicine Teaching [facility] Discharge Summary  Patient name: Michelle Lynch Medical record number: [id number] Date of birth: 08/16/1961 Age: 56 y.o. Gender: female Date of Admission: 06/04/2017  Date of Discharge: 06/05/2017 Admitting Physician: Doreene ElandKehinde T Eniola, MD  Primary Care Provider: Patient, No Pcp Per Consultants: Neurology   Indication for Hospitalization: amnesia   Discharge Diagnoses/Problem List:  Confusion   Disposition: home  Discharge Condition: stable, improved  Discharge Exam:  General: awake and alert, anxious, sitting up in bed watching tv Neuro: unable to complete due to patient becoming tearful and not wanting to finish exam, sensation intact bilaterally Eyes: EOMI Cardiovascular: RRR, no MRG Respiratory: CTAB, no wheezes, rales, or rhonchi  Abdomen: soft, non tender, non distended Extremities: no edema, non tender  Brief Hospital Course:  [name] is a 56 y.o. Female presenting with new onset confusion and amnesia. Amnesia began on the afternoon of 06/04/2017. Neurology was consulted and amnesia thought to be transient global amnesia. Possible triggers include pain of broken tooth, stress of financial concerns, and recent death of her mother. Patient showed no evidence of trauma on exam. CT of head was negative. EKG was normal on admission. Patient denied substance use and UDS was negative. No electrolyte derangements on BMP. Patients memory improved on 06/05/2017 and can remember yesterday's morning routine leading up to dental appointment but does not remember actual appointment, patient states she blocked it out as a traumatic event. MRI showing asymmetric prominence of right choroidal fissure without definite features of mesial temporal sclerosis.. Neurology has signed off on patient and recommended outpatient EEG and [facility].   Issues for Follow Up:  1. Will need outpatient hospital follow up for amnesia with  PCP 2. [facility] need [facility] for global amnesia, and EEG 3. Outpatient follow up for generalized anxiety. Consider SSRI, with possible ativan bridge 4. Have prescribed 2 ativan tablets prn for anxiety regarding dental procedure  Significant Procedures: none  Significant Labs and Imaging:   Recent Labs Lab 06/04/17 1758 06/04/17 1821 06/05/17 0419  WBC 6.1  --  6.0  HGB 13.1 13.6 12.4  HCT 40.0 40.0 37.5  PLT 255  --  240    Recent Labs Lab 06/04/17 1758 06/04/17 1821 06/05/17 0419  NA 141 143 139  K 3.4* 3.4* 3.7  CL 106 105 105  CO2 26  --  27  GLUCOSE 104* 102* 104*  BUN 9 9 7   CREATININE 0.68 0.60 0.66  CALCIUM 9.4  --  9.0  ALKPHOS 75  --   --   AST 23  --   --   ALT 11*  --   --   ALBUMIN 4.4  --   --    Drugs of Abuse     Component Value Date/Time   LABOPIA NONE DETECTED 06/04/2017 2353   COCAINSCRNUR NONE DETECTED 06/04/2017 2353   LABBENZ NONE DETECTED 06/04/2017 2353   AMPHETMU NONE DETECTED 06/04/2017 2353   THCU NONE DETECTED 06/04/2017 2353   LABBARB NONE DETECTED 06/04/2017 2353      Mr Brain Wo Contrast  Result Date: 06/05/2017 CLINICAL DATA:  Confusion.  Transient global amnesia. EXAM: MRI HEAD WITHOUT CONTRAST TECHNIQUE: Multiplanar, multiecho pulse sequences of the brain and surrounding structures were obtained without intravenous contrast. COMPARISON:  CT head 06/04/2017. FINDINGS: Brain: No evidence for acute stroke, hemorrhage, hydrocephalus, mass lesion, or extra-axial fluid. There is asymmetric prominence of the RIGHT choroidal fissure, without definite features  of mesial temporal sclerosis. There may be mild hippocampal atrophy, but there is no increased T2 signal in the temporal lobe. Vascular: Normal flow voids. Skull and upper cervical spine: Normal marrow signal. Sinuses/Orbits: Negative. Other: None. IMPRESSION: Asymmetric prominence of the RIGHT choroidal fissure without definite features of mesial  temporal sclerosis. Otherwise unremarkable MR brain. Electronically Signed   By: Elsie StainJohn T Curnes M.D.   On: 06/05/2017 10:35   Ct Head Code Stroke W/o Cm  Result Date: 06/04/2017 CLINICAL DATA:  Code stroke. Initial evaluation for acute confusion, jaw pain. EXAM: CT HEAD WITHOUT CONTRAST TECHNIQUE: Contiguous axial images were obtained from the base of the skull through the vertex without intravenous contrast. COMPARISON:  None. FINDINGS: Brain: Cerebral volume within normal limits. No acute intracranial hemorrhage. No evidence for acute large vessel territory infarct. No mass lesion, midline shift or mass effect. No hydrocephalus. No extra-axial fluid collection. Probable small choroidal fissure cyst noted on the right. Vascular: No hyperdense vessel. Skull: Scalp soft tissues and calvarium within normal limits. Sinuses/Orbits: Globes and orbital soft tissues within normal limits. Minimal layering opacity within the right maxillary sinus. Paranasal sinuses are otherwise clear. No mastoid effusion. The ASPECTS [facility]([place]ta Stroke Program Early CT Score) - Ganglionic level infarction (caudate, lentiform nuclei, internal capsule, insula, M1-M3 cortex): 7 - Supraganglionic infarction (M4-M6 cortex): 3 Total score (0-10 with 10 being normal): 10 IMPRESSION: 1. Negative head CT.  No acute intracranial process identified. 2. ASPECTS is 10 Critical Value/emergent results were called by telephone at the time of interpretation on 06/04/2017 at 6:35 pm to Dr. Wilford CornerArora , who verbally acknowledged these results. Electronically Signed   By: Rise MuBenjamin  McClintock M.D.   On: 06/04/2017 18:37     Results/Tests Pending at Time of Discharge: none  Discharge Medications:  Allergies as of 06/05/2017   No Known Allergies     Medication List    TAKE these medications   LORazepam 0.5 MG tablet Commonly known as:  ATIVAN Take 1 tablet (0.5 mg total) by mouth as needed for anxiety (anxiety for dental procedure).       Discharge  Instructions: Please refer to Patient Instructions section of EMR for full details.  Patient was counseled important signs and symptoms that should prompt return to medical care, changes in medications, dietary instructions, activity restrictions, and follow up appointments.   Follow-Up Appointments: Dr. Leveda AnnaHensel on 06/16/2017 @ 9:50 am  [facility] [address] [place], [place] [address]  Oralia ManisAbraham, Emerald Shor, DO 06/05/2017, 12:10 PM PGY-1, [facility]

## 2017-06-05 NOTE — Progress Notes (Addendum)
Subjective: Patient seen and examined this morning. Much less repetitive questioning this morning. She refused MRI at some point last night but at that time according to family she was not feeling well and was still very anxious. She is now amenable to getting the MRI.  Exam: Vitals:   06/04/17 2318 06/05/17 0544  BP: (!) 144/67 (!) 106/53  Pulse: 73 67  Resp: 16 18  Temp: 97.9 F (36.6 C) 98.5 F (36.9 C)    Constitutional: Appears well-developed and well-nourished.  Psych: Affect appropriate to situation Eyes: No scleral injection HENT: No OP obstrucion Head: Normocephalic.  Cardiovascular: Normal rate and regular rhythm.  Respiratory: Effort normal and breath sounds normal to anterior ascultation GI: Soft.  No distension. There is no tenderness.  Skin: WDI  Neuro: Mental Status: Patient is awake, alert, oriented to person and place.  No more perseveration. Naming comprehension and repetition intact. Speech is not dysarthric Cranial Nerves: II: Visual Fields are full. Pupils are equal, round, and reactive to light.  III,IV, VI: EOMI without ptosis or diploplia.  V: Facial sensation is symmetric to temperature VII: Facial movement is symmetric.  VIII: hearing is intact to voice X: Uvula elevates symmetrically XI: Shoulder shrug is symmetric. XII: tongue is midline without atrophy or fasciculations.  Motor: Tone is normal. Bulk is normal. 5/5 strength was present in all four extremities. Sensory: Sensation is symmetric to light touch and temperature in the arms and legs Deep Tendon Reflexes: 2+ and symmetric in the biceps and patellae Plantars: Toes are downgoing bilaterally. Cerebellar: FNF and HKS are intact bilaterall  Pertinent Labs: Urinary toxicology screen negative. CBC BMP unremarkable MRI pending EEG pending   Assessment 56 year old woman with no past medical history presented with acute onset of confusion. Her examination is consistent with  transient global amnesia as she had no other focal deficits but Repeating the Same Questions. This episode was most likely triggered by stressful situation where she had required extensive dental treatment and she had extreme stress about it.  Impression Transient global amnesia Evaluate for stroke-less likely Seizures-less likely  Recommendations -Awaiting the MRI and the EEG. -If both of them are normal, the patient can be discharged home with a one-time neurology follow-up in 2-3 months. -I will follow the results of the MRI and EEG and update my note later in the day as results become available. Please call with questions  Milon DikesAshish Wilbur Labuda, MD [facility] [phone]  If 7pm to 7am, please call on call as listed on AMION.   ADDENDUM 1156 hrs MRI complete. Can get EEG outpatient as patient keen to leave. Questionable increased right choroidal fissure prominence. No evidence of MTS. OK to discharge on no AED with outpatient f/[facility] with [facility]. Communicated to the Family Medicine resident on call from primary team,  Milon DikesAshish Delrico Minehart, MD

## 2017-06-05 NOTE — Procedures (Signed)
Date of recording 06/05/17  Referring physician Doralee AlbinoWilliam Hensel  Reason for the study- Transient global amnesia  Technical-This is a multichannel digital EEG recording using the international 10-20 placement system.   Description of the recording Posterior dominant rhythm is 9-10Hz  symmetrical reactive and well sustained. Photic stimulation did not produce any abnormal response. During drowsiness there was intermittent delta and theta slowing. Non-REM stage II sleep was not obtained. Epileptiform features were not seen during this recording.  Impression The EEG is normal with patient recorded awake and drowsy state. Epileptiform features were not seen during this recording.

## 2017-06-05 NOTE — Discharge Instructions (Signed)
You were admitted for transient global amnesia.   Please follow up with Dr. Leveda AnnaHensel on 06/16/2017 @9 :50 am, please arrive 15 min early   I have prescribed you Ativan as needed for anxiety regarding your dental procedure. Please take 1 tablet by mouth as needed.    Transient Global Amnesia Transient global amnesia causes a sudden and temporary (transient) loss of memory (amnesia). While you may recall memories from your distant past, including being able to recognize people you know well, you may not recall things that happened more recently in the past days, months, or even year. A transient global amnesia episode does not last longer than 24 hours. Transient global amnesia does not affect your other brain functions. Your memory usually returns to normal after an episode is over. One episode of transient global amnesia does not make you more likely to have a stroke, a relapse, or other complications. What are the causes? The cause of this condition is not known. What increases the risk? Transient global amnesia is more likely to develop in people who:  Are 3950-56 years old.  Have a history of migraine headaches.  What are the signs or symptoms? The main symptoms of this condition include:  The inability to remember recent events.  Asking repetitive questions about the situation and surroundings and not recalling the answers to these questions.  Other symptoms include:  Restlessness and nervousness.  Confusion.  Headaches.  Dizziness.  Nausea.  How is this diagnosed? Your health care provider may suspect transient global amnesia based on your symptoms. Your health care provider will do a physical exam. This may include a test to check your mental abilities (cognitive evaluation). You may also have imaging studies done to check your brain function. These may include:  Electroencephalography (EEG).  Diffusion-weighted imaging (DWI).  MRI.  How is this treated? There is no  treatment for this condition. An episode typically goes away on its own after a few hours. If you also have a seizure or migraine during an episode, you will receive treatment for these conditions. This may include medicines. Follow these instructions at home:  Take medicines only as directed by your health care provider.  Tell your family or friends that you have transient global amnesia. Ask them to help you avoid physical exertion, including sexual intercourse, swimming, and straining while holding your breath (Valsalva maneuver), until the episode passes. These are events that can bring on transient global amnesia attacks. Contact a health care provider if:  You have a migraine and it does not go away after you have followed your treatment plan for this condition.  You have a seizure for the first time, or a seizure that is different from seizures you normally have.  You experience transient global amnesia repeatedly. This information is not intended to replace advice given to you by your health care provider. Make sure you discuss any questions you have with your health care provider. Document Released: 12/05/2004 Document Revised: 07/01/2016 Document Reviewed: 07/13/2014 Elsevier Interactive Patient Education  2018 [employer]   Seizure, Adult A seizure is a sudden burst of abnormal electrical activity in the brain. The abnormal activity temporarily interrupts normal brain function, causing a person to experience any of the following:  Involuntary movements.  Changes in awareness or consciousness.  Uncontrollable shaking (convulsions).  Seizures usually last from 30 seconds to 2 minutes. They usually do not cause permanent brain damage unless they are prolonged. What can cause a seizure to happen? Seizures can happen  for many reasons including:  A fever.  Low blood sugar.  A medicine.  An illnesses.  A brain injury.  Some people who have a seizure never have another  one. People who have repeated seizures have a condition called epilepsy. What are the symptoms of a seizure? Symptoms of a seizure vary greatly from person to person. They include:  Convulsions.  Stiffening of the body.  Involuntary movements of the arms or legs.  Loss of consciousness.  Breathing problems.  Falling suddenly.  Confusion.  Head nodding.  Eye blinking or fluttering.  Lip smacking.  Drooling.  Rapid eye movements.  Grunting.  Loss of bladder control and bowel control.  Staring.  Unresponsiveness.  Some people have symptoms right before a seizure happens (aura) and right after a seizure happens. Symptoms of an aura include:  Fear or anxiety.  Nausea.  Feeling like the room is spinning (vertigo).  A feeling of having seen or heard something before (deja vu).  Odd tastes or smells.  Changes in vision, such as seeing flashing lights or spots.  Symptoms that may follow a seizure include:  Confusion.  Sleepiness.  Headache.  Weakness of one side of the body.  Follow these instructions at home: Medicines   Take over-the-counter and prescription medicines only as told by your health care provider.  Avoid any substances that may prevent your medicine from working properly, such as alcohol. Activity  Do not drive, swim, or do any other activities that would be dangerous if you had another seizure. Wait until your health care provider approves.  If you live in the [place] check with your local DMV (department of motor vehicles) to find out about the local driving laws. Each state has specific rules about when you can legally return to driving.  Get enough rest. Lack of sleep can make seizures more likely to occur. Educating others Teach friends and family what to do if you have a seizure. They should:  Lay you on the ground to prevent a fall.  Cushion your head and body.  Loosen any tight clothing around your neck.  Turn you on your  side. If vomiting occurs, this helps keep your airway clear.  Stay with you until you recover.  Not hold you down. Holding you down will not stop the seizure.  Not put anything in your mouth.  Know whether or not you need emergency care.  General instructions  Contact your health care provider each time you have a seizure.  Avoid anything that has ever triggered a seizure for you.  Keep a seizure diary. Record what you remember about each seizure, especially anything that might have triggered the seizure.  Keep all follow-up visits as told by your health care provider. This is important. Contact a health care provider if:  You have another seizure.  You have seizures more often.  Your seizure symptoms change.  You continue to have seizures with treatment.  You have symptoms of an infection or illness. They might increase your risk of having a seizure. Get help right away if:  You have a seizure: ? That lasts longer than 5 minutes. ? That is different than previous seizures. ? That leaves you unable to speak or use a part of your body. ? That makes it harder to breathe. ? After a head injury.  You have: ? Multiple seizures in a row. ? Confusion or a severe headache right after a seizure.  You are having seizures more often.  You  do not wake up immediately after a seizure.  You injure yourself during a seizure. These symptoms may represent a serious problem that is an emergency. Do not wait to see if the symptoms will go away. Get medical help right away. Call your local emergency services (911 in the [place] Do not drive yourself to the hospital. This information is not intended to replace advice given to you by your health care provider. Make sure you discuss any questions you have with your health care provider. Document Released: 10/25/2000 Document Revised: 06/23/2016 Document Reviewed: 05/31/2016 Elsevier Interactive Patient Education  2017 [employer].

## 2017-06-05 NOTE — Progress Notes (Signed)
RN discussed discharge instructions with patient and patient's family. They verbalized understanding of discharge instructions including f/u with pcp, neurology. Given rx for ativan, given discharge instructions. Assessment unchanged. Patient getting dressed.

## 2017-06-05 NOTE — Progress Notes (Signed)
Routine EEG completed, results pending. 

## 2017-06-05 NOTE — Care Management Note (Signed)
Case Management Note  Patient Details  Name: Michelle Lynch MRN: [id number] Date of Birth: 05/23/1961  Subjective/Objective:                    Action/Plan: Pt discharging home with self care. Pt without a PCP listed. Pt to f/u with Dr Leveda AnnaHensel after d/c. Pt has insurance and transportation home. No further needs per CM.   Expected Discharge Date:  06/05/17               Expected Discharge Plan:  Home/Self Care  In-House Referral:     Discharge planning Services     Post Acute Care Choice:    Choice offered to:     DME Arranged:    DME Agency:     HH Arranged:    HH Agency:     Status of Service:  Completed, signed off  If discussed at [employer] Length of Stay Meetings, dates discussed:    Additional Comments:  Kermit BaloKelli F Sindy Mccune, RN 06/05/2017, 12:58 PM

## 2017-06-16 ENCOUNTER — Ambulatory Visit (INDEPENDENT_AMBULATORY_CARE_PROVIDER_SITE_OTHER): Payer: BLUE CROSS/BLUE SHIELD | Admitting: Family Medicine

## 2017-06-16 ENCOUNTER — Encounter: Payer: Self-pay | Admitting: Family Medicine

## 2017-06-16 DIAGNOSIS — G454 Transient global amnesia: Secondary | ICD-10-CM

## 2017-06-16 DIAGNOSIS — E7800 Pure hypercholesterolemia, unspecified: Secondary | ICD-10-CM

## 2017-06-16 DIAGNOSIS — E785 Hyperlipidemia, unspecified: Secondary | ICD-10-CM | POA: Insufficient documentation

## 2017-06-16 DIAGNOSIS — Z1159 Encounter for screening for other viral diseases: Secondary | ICD-10-CM

## 2017-06-16 NOTE — Patient Instructions (Addendum)
Come back if you like [place].  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 You will need more testing for the memory thing only if it happens again. You need  1. A tetanus booster every 10 years 2. A colonoscopy or other colon cancer screening test.   3. I assume you are up to date on your pap and mammogram.  If you become a patient here, I will need you to sign a release so that I can get those results.   4. Flu shot in the fall  I will get your cholesterol and Hep C screen today and call with the results.

## 2017-06-16 NOTE — Assessment & Plan Note (Addendum)
Recovered.  No further WU unless recurs.   Not a CVA.

## 2017-06-17 ENCOUNTER — Encounter: Payer: Self-pay | Admitting: Family Medicine

## 2017-06-17 LAB — LIPID PANEL
CHOL/HDL RATIO: 5.5 ratio — AB (ref 0.0–4.4)
Cholesterol, Total: 263 mg/dL — ABNORMAL HIGH (ref 100–199)
HDL: 48 mg/dL (ref >39)
LDL Calculated: 167 mg/dL — ABNORMAL HIGH (ref 0–99)
TRIGLYCERIDES: 239 mg/dL — AB (ref 0–149)
VLDL CHOLESTEROL CAL: 48 mg/dL — AB (ref 5–40)

## 2017-06-17 LAB — HEPATITIS C ANTIBODY: Hep C Virus Ab: 0.1 s/co ratio (ref 0.0–0.9)

## 2017-06-17 NOTE — Progress Notes (Signed)
   Subjective:    Patient ID: Michelle Lynch, female    DOB: 05/06/1961, 56 y.o.   MRN: [id number]  HPI FU hospitalization: Temporary Global amnesia. New to practice, she is still deciding whether to get ongoing care with us.  Issues: 1. Had 24 hours of amnesia after receiving painful news to her.  She saw dentist and told needed a tooth pulled.  No drugs given.  Dental health is extremely important to her and her family.  York SpanielSaid being told she would lose a tooth is equivalent to being to she would lose a leg.  WU including a careful stroke WU was negative.  No drugs or meds involved.  No previous history.  Feels fine now - completely back to normal. 2. No general medical doctor and behind on health maint.  Sees Ob/gyn and reports being up to date on Pap and mammo.  Never had colonoscopy or other colon cancer screen.  Screened for HIV in hosp.  Never screened for hep c.  Had cholesterol done a few years ago at a community screen and told results were fine.      Review of Systems No headache, focal neuro sx, SOB, Chest pain, change in bowel, bladder or appetite.  No bleeding.     Objective:   Physical ExamHeent normal Neck supple without mass Lungs clear Cardiac RRR without m or g Abd benign Neuro CN 2-12 intact Strength, sensation, gait and mentation normal        Assessment & Plan:

## 2017-06-17 NOTE — Assessment & Plan Note (Addendum)
10 year ASCVD risk based on visit data = 2.7% despite LDL=167.  No statin. Recommend diet and exercise.

## 2017-06-17 NOTE — Assessment & Plan Note (Signed)
Screen x 1 

## 2017-10-22 ENCOUNTER — Ambulatory Visit (INDEPENDENT_AMBULATORY_CARE_PROVIDER_SITE_OTHER): Payer: BLUE CROSS/BLUE SHIELD

## 2017-10-22 ENCOUNTER — Ambulatory Visit (INDEPENDENT_AMBULATORY_CARE_PROVIDER_SITE_OTHER): Payer: BLUE CROSS/BLUE SHIELD | Admitting: Physician Assistant

## 2017-10-22 ENCOUNTER — Encounter (INDEPENDENT_AMBULATORY_CARE_PROVIDER_SITE_OTHER): Payer: Self-pay | Admitting: Physician Assistant

## 2017-10-22 DIAGNOSIS — M25511 Pain in right shoulder: Secondary | ICD-10-CM | POA: Diagnosis not present

## 2017-10-22 DIAGNOSIS — S63501A Unspecified sprain of right wrist, initial encounter: Secondary | ICD-10-CM

## 2017-10-22 MED ORDER — HYDROCODONE-ACETAMINOPHEN 5-325 MG PO TABS: 1.0000 | ORAL_TABLET | Freq: Four times a day (QID) | ORAL | 0 refills | Status: DC | PRN

## 2017-10-22 NOTE — Progress Notes (Signed)
Office Visit Note   Patient: Michelle Lynch           Date of Birth: 07/28/1961           MRN: [id number] Visit Date: 10/22/2017              Requested by: No referring provider defined for this encounter. PCP: Patient, No Pcp Per   Assessment & Plan: Visit Diagnoses:  1. Right wrist sprain, initial encounter   2. Acute pain of right shoulder     Plan: Recommend that she continue to use the wrist splint that she had at home on the right wrist.  She can come out of it for gentle range of motion of the wrist and also for motion of the hand recommended in a few days that she start using the soft squeeze ball to help with circulation in the hand and arm.  She also will work on wall crawls, pendulum and Codman exercises of the right shoulder.  She is given Norco to take mainly at night due to the pain she is having.  We will see her back in 2 weeks for reexamination.  Possible repeat x-rays at that time to rule out occult fracture of the wrist or shoulder depending upon her pain.  Questions were encouraged and answered at length.  She is given Norco to take for pain at night.  Follow-Up Instructions: Return in about 2 weeks (around 11/05/2017).   Orders:  Orders Placed This Encounter  Procedures  . XR Wrist 2 Views Right  . XR Shoulder Right  . XR Wrist 2 Views Right   Meds ordered this encounter  Medications  . HYDROcodone-acetaminophen (NORCO) 5-325 MG tablet    Sig: Take 1 tablet by mouth every 6 (six) hours as needed for moderate pain. One to two tabs every 4-6 hours for pain    Dispense:  20 tablet    Refill:  0      Procedures: No procedures performed   Clinical Data: No additional findings.   Subjective: Chief Complaint  Patient presents with  . Right Wrist - Pain, Injury  . Right Shoulder - Pain, Injury    HPI 56 year old female comes in today complaining of right wrist and right shoulder pain.  She was taking her daughter's dogs out and fell off the porch on  due to some ice yesterday.   She had no loss of consciousness no dizziness.  No injury to her head.  She immediately noticed some swelling at her right elbow.  Pain in her elbow is dissipated now she is having pain in the right shoulder and also right wrist.  She notes swelling in the right wrist.  She has bruising over the right shoulder girdle.  She denies any radicular symptoms down the right arm.  No other particular injuries.  She denies any chest pain shortness of breath.  Been taking ibuprofen and Advil PM. The Review of Systems Please see HPI otherwise negative  Objective: Vital Signs: There were no vitals taken for this visit.  Physical Exam  Constitutional: She is oriented to person, place, and time. She appears well-developed and well-nourished. No distress.  Pulmonary/Chest: Effort normal.  Neurological: She is alert and oriented to person, place, and time.  Skin: She is not diaphoretic.  Psychiatric: She has a normal mood and affect.    Ortho Exam Right shoulder she has excellent cuff strength.  And bring full forward flexion.  Impingement testing causes pain.  Right  elbow she has full range of motion without significant pain.  Contusion over the posterior elbow region.  She has no tenderness over the radial head.  She has full supination pronation of the forearm.  Right wrist with edema no ecchymosis appreciated.  She has tenderness over the distal radius and ulna.  Particularly tenderness over the distal styloid.  She also has some tenderness over the snuffbox region but maximal tenderness over the [facility].  Has full sensation throughout the hand.  No gross deformity of the hand or wrist.  Specialty Comments:  No specialty comments available.  Imaging: Xr Shoulder Right  Result Date: 10/22/2017 Right shoulder 3 views: No acute fracture.  Humeral heads well located.  The glenohumeral joint is well-maintained.  AC joint no acute fracture/separation.  No clavicle fracture.   Shoulders well located   Xr Wrist 2 Views Right  Result Date: 10/22/2017 Right wrist 3 views: No acute fracture though bony abnormalities.  Xr Wrist 2 Views Right  Result Date: 10/22/2017 Scaphoid view right wrist: No acute fracture scaphoid.  The remainder of the carpal bones appear well located without any acute findings.  Wrist joint no acute fracture    PMFS History: Patient Active Problem List   Diagnosis Date Noted  . Hypercholesteremia 06/16/2017  . Encounter for hepatitis C screening test for low risk patient 06/16/2017  . TGA (transient global amnesia) 06/04/2017  . Pain, dental   . Overweight    History reviewed. No pertinent past medical history.  History reviewed. No pertinent family history.  History reviewed. No pertinent surgical history. Social History   Occupational History  . Not on file  Tobacco Use  . Smoking status: Never Smoker  . Smokeless tobacco: Never Used  Substance and Sexual Activity  . Alcohol use: No  . Drug use: No  . Sexual activity: Not on file

## 2018-11-13 DIAGNOSIS — Z1231 Encounter for screening mammogram for malignant neoplasm of breast: Secondary | ICD-10-CM | POA: Diagnosis not present

## 2018-11-13 DIAGNOSIS — Z803 Family history of malignant neoplasm of breast: Secondary | ICD-10-CM | POA: Diagnosis not present

## 2019-03-08 IMAGING — CT CT HEAD CODE STROKE
3 series · 15 of 47 positions shown, 18 images · non-contrast
Comparison: None.

CLINICAL DATA: Code stroke. Initial evaluation for acute confusion,
jaw pain.

EXAM:
CT HEAD WITHOUT CONTRAST
TECHNIQUE: Contiguous axial images were obtained from the base of the skull
through the vertex without intravenous contrast.

[Series 3: head 5.0 st · axial · 0.41mm/px · z∈[-117,+13]mm · 9 of 32 slices shown, 12 images]
[im 3/32  brain]
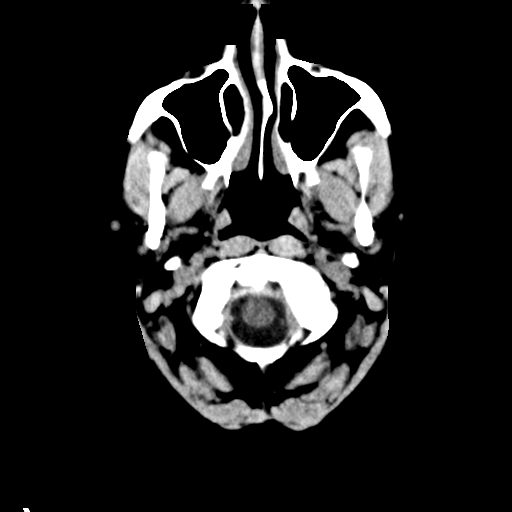
[im 3/32  bone]
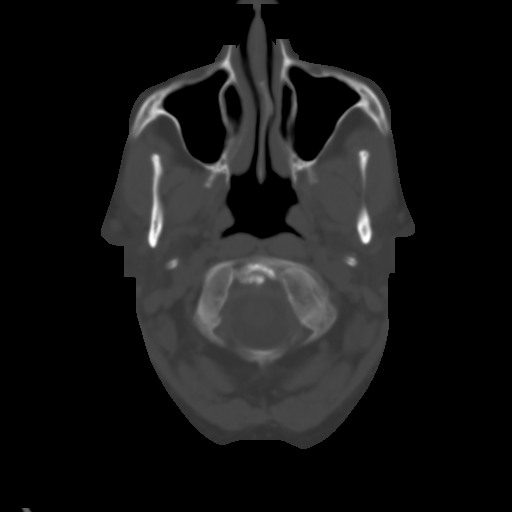
[im 6/32  brain]
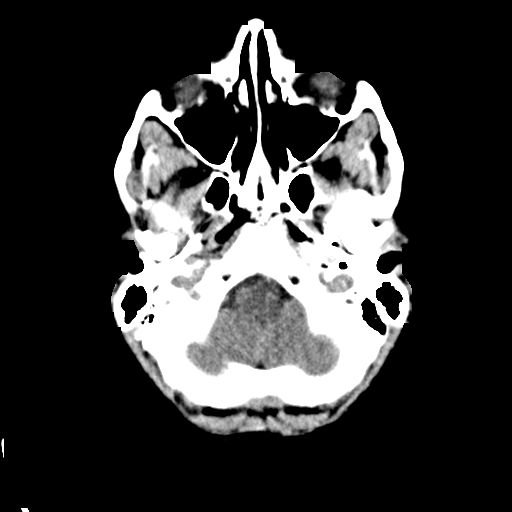
[im 9/32  brain]
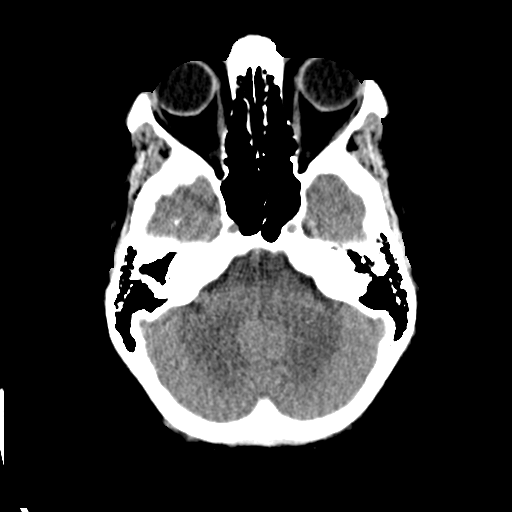
[im 12/32  brain]
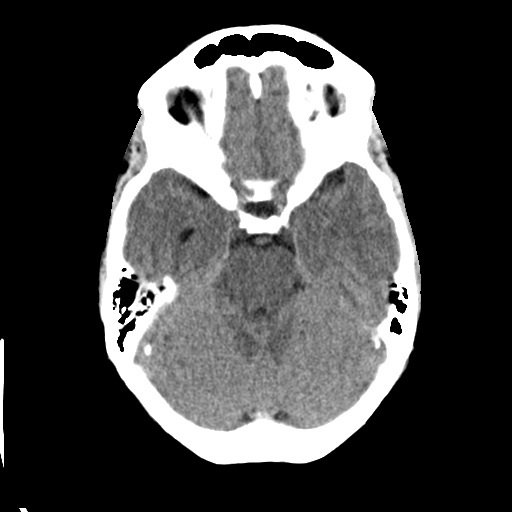
[im 17/32  brain]
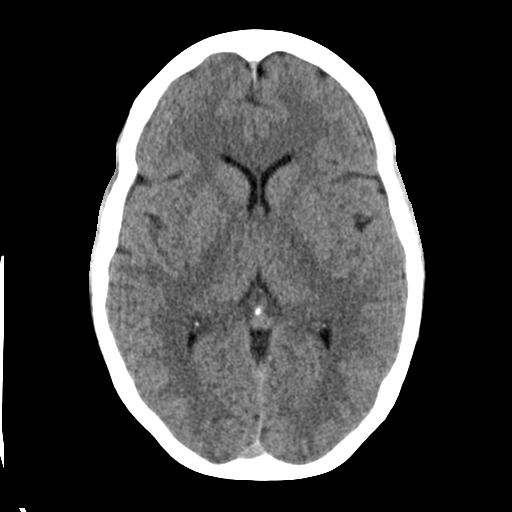
[im 17/32  bone]
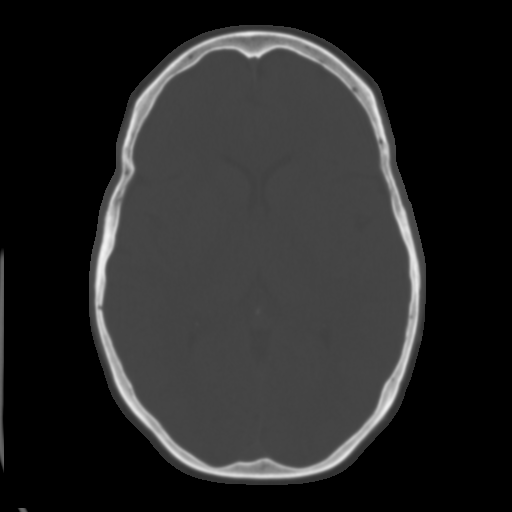
[im 20/32  brain]
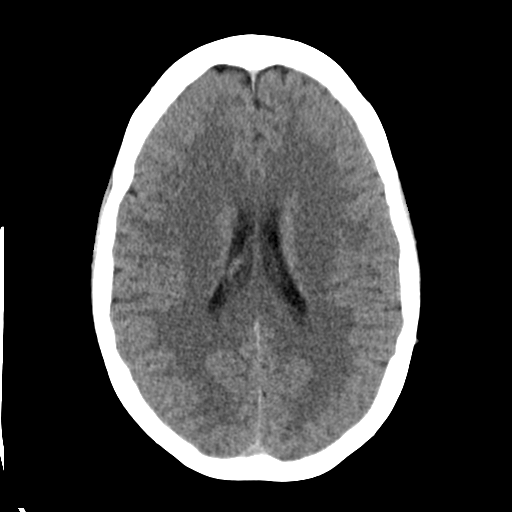
[im 23/32  brain]
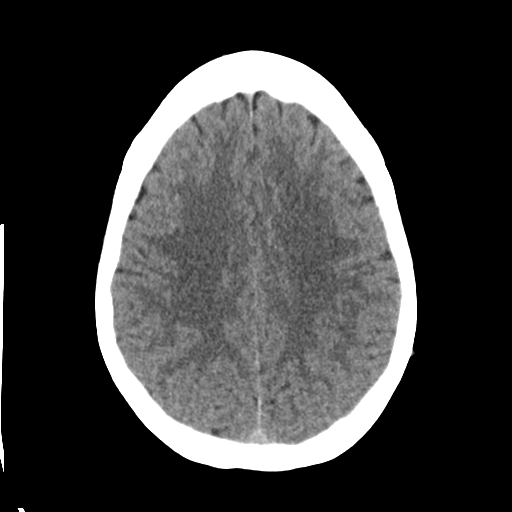
[im 26/32  brain]
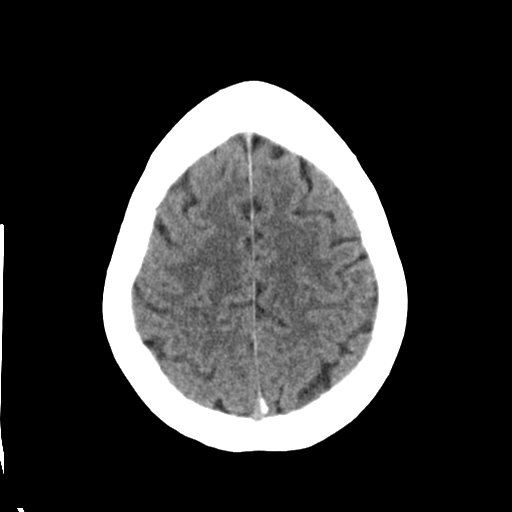
[im 29/32  brain]
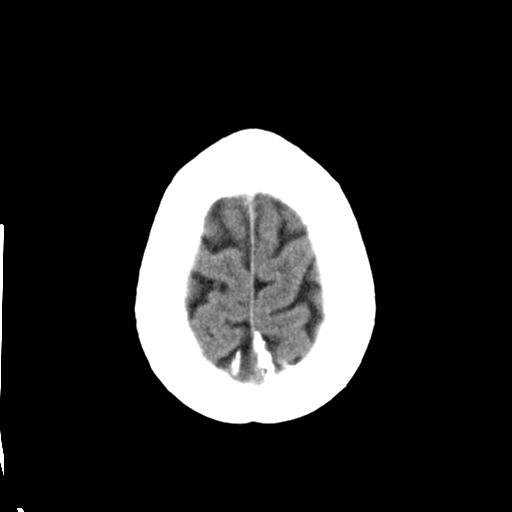
[im 29/32  bone]
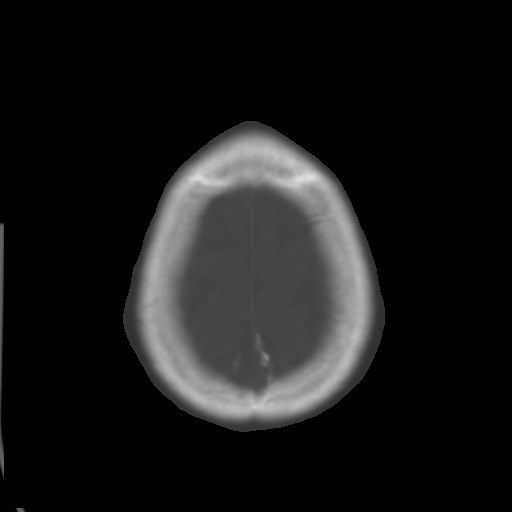

[Series 5: head 3.0 cor st · coronal · 0.30mm/px · 3 of 67 slices shown]
[im 23/67  brain]
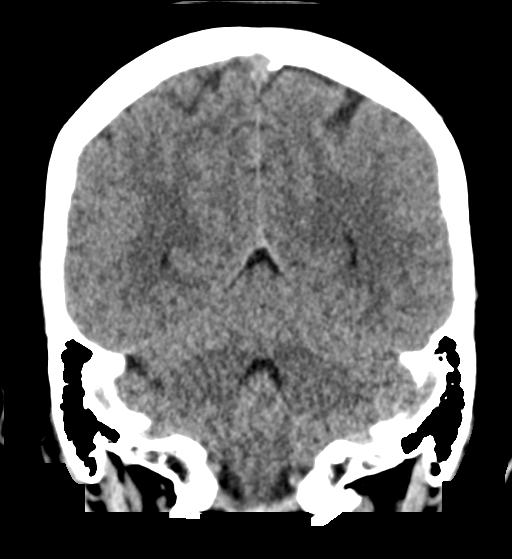
[im 30/67  brain]
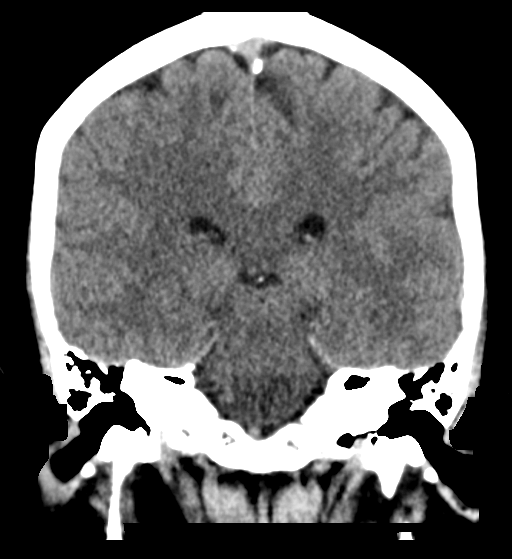
[im 37/67  brain]
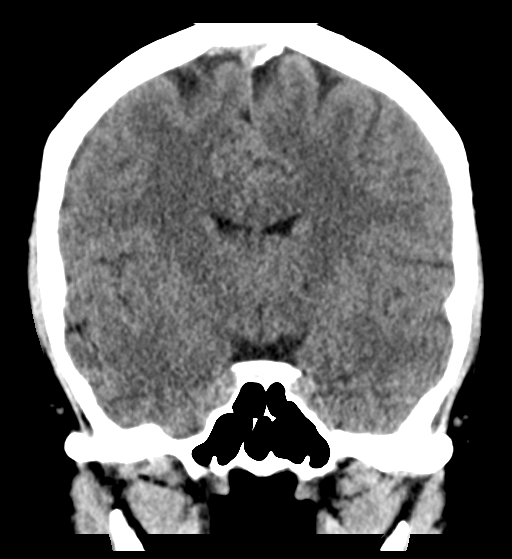

[Series 6: head 3.0 sag st · sagittal · 0.30mm/px · 3 of 53 slices shown]
[im 18/53  brain]
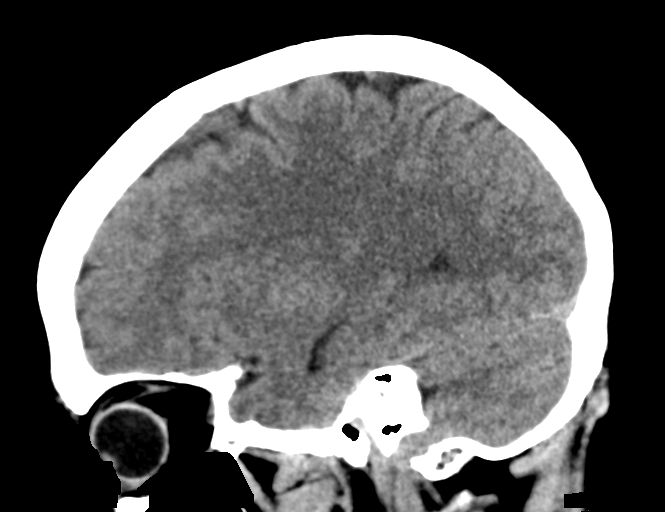
[im 27/53  brain]
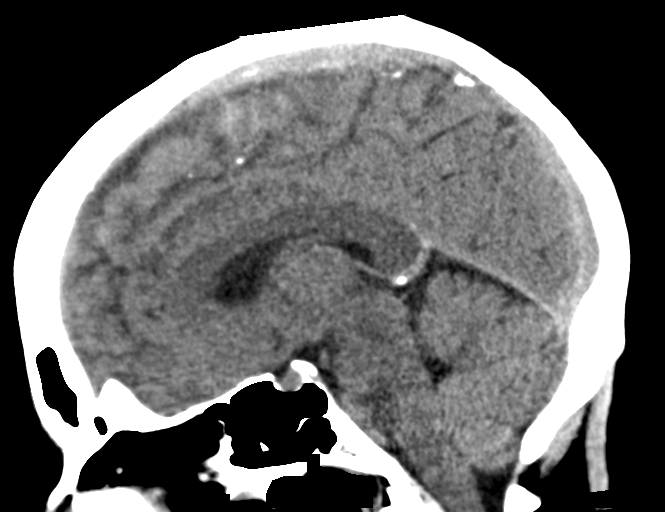
[im 35/53  brain]
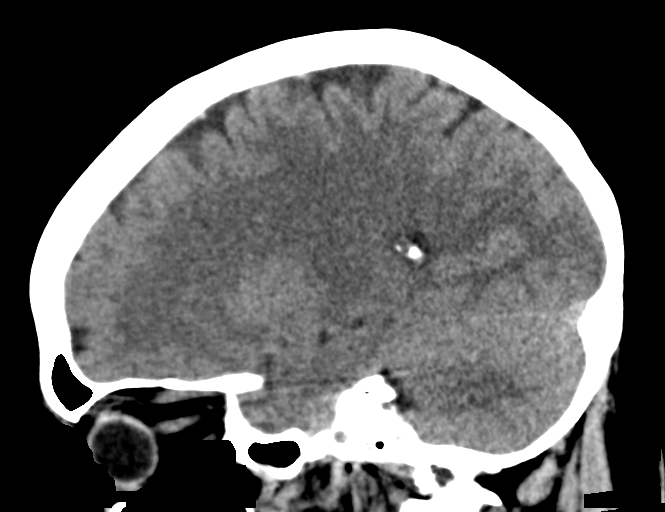

[15 of 47 positions shown; findings below may reference images not displayed]

FINDINGS: Brain: Cerebral volume within normal limits. No acute intracranial
hemorrhage. No evidence for acute large vessel territory infarct. No
mass lesion, midline shift or mass effect. No hydrocephalus. No
extra-axial fluid collection. Probable small choroidal fissure cyst
noted on the right.

Vascular: No hyperdense vessel.

Skull: Scalp soft tissues and calvarium within normal limits.

Sinuses/Orbits: Globes and orbital soft tissues within normal
limits. Minimal layering opacity within the right maxillary sinus.
Paranasal sinuses are otherwise clear. No mastoid effusion. The

ASPECTS (Alberta Stroke Program Early CT Score)

- Ganglionic level infarction (caudate, lentiform nuclei, internal
capsule, insula, M1-M3 cortex): 7

- Supraganglionic infarction (M4-M6 cortex): 3

Total score (0-10 with 10 being normal): 10
IMPRESSION: 1. Negative head CT.  No acute intracranial process identified.
2. ASPECTS is 10
Critical Value/emergent results were called by telephone at the time
of interpretation on 06/04/2017 at [DATE] to Dr. Calum , who
verbally acknowledged these results.

## 2020-01-12 LAB — HM PAP SMEAR: HM Pap smear: NEGATIVE

## 2020-01-12 LAB — TSH: TSH: 1.74 (ref <=5.90)

## 2020-01-26 LAB — FECAL OCCULT BLOOD, GUAIAC: Fecal Occult Blood: NEGATIVE

## 2020-11-02 DIAGNOSIS — R030 Elevated blood-pressure reading, without diagnosis of hypertension: Secondary | ICD-10-CM | POA: Diagnosis not present

## 2020-11-02 DIAGNOSIS — H1031 Unspecified acute conjunctivitis, right eye: Secondary | ICD-10-CM | POA: Diagnosis not present

## 2020-11-20 DIAGNOSIS — Z1231 Encounter for screening mammogram for malignant neoplasm of breast: Secondary | ICD-10-CM | POA: Diagnosis not present

## 2021-06-15 ENCOUNTER — Other Ambulatory Visit: Payer: Self-pay

## 2021-06-15 ENCOUNTER — Ambulatory Visit: Payer: Self-pay

## 2021-06-15 ENCOUNTER — Encounter: Payer: Self-pay | Admitting: Physician Assistant

## 2021-06-15 ENCOUNTER — Ambulatory Visit (INDEPENDENT_AMBULATORY_CARE_PROVIDER_SITE_OTHER): Payer: BC Managed Care – PPO | Admitting: Physician Assistant

## 2021-06-15 DIAGNOSIS — M25561 Pain in right knee: Secondary | ICD-10-CM

## 2021-06-15 MED ORDER — LIDOCAINE HCL 1 % IJ SOLN
3.0000 mL | INTRAMUSCULAR | Status: AC | PRN
  Administered 2021-06-15: 3 mL

## 2021-06-15 MED ORDER — METHYLPREDNISOLONE ACETATE 40 MG/ML IJ SUSP
40.0000 mg | INTRAMUSCULAR | Status: AC | PRN
  Administered 2021-06-15: 40 mg via INTRA_ARTICULAR

## 2021-06-15 NOTE — Progress Notes (Signed)
Office Visit Note   Patient: Michelle Lynch           Date of Birth: 09/24/1961           MRN: [id number] Visit Date: 06/15/2021              Requested by: No referring provider defined for this encounter. PCP: Patient, No Pcp Per (Inactive)   Assessment & Plan: Visit Diagnoses:  1. Acute pain of right knee     Plan: We will see her back in 2 weeks she is to be mindful of any mechanical symptoms.  If she continues to have significant pain in the knee and unable to bear weight she can definitely call the office and we will try to get an MRI approved to rule out a meniscal tear involving the knee.  Questions encouraged and answered at length.  She is encouraged to bear weight as tolerated on the knee.  She will work on Dance movement psychotherapist exercises.  Questions were encouraged and answered at length.  Follow-Up Instructions: Return in about 2 weeks (around 06/29/2021).   Orders:  Orders Placed This Encounter  Procedures   Large Joint Inj   XR Knee 1-2 Views Right   No orders of the defined types were placed in this encounter.     Procedures: Large Joint Inj on 06/15/2021 4:43 PM Indications: pain Details: 22 G 1.5 in needle, superolateral approach  Arthrogram: No  Medications: 3 mL lidocaine 1 %; 40 mg methylPREDNISolone acetate 40 MG/ML Aspirate: 5 mL yellow Outcome: tolerated well, no immediate complications Procedure, treatment alternatives, risks and benefits explained, specific risks discussed. Consent was given by the patient. Immediately prior to procedure a time out was called to verify the correct patient, procedure, equipment, support staff and site/side marked as required. Patient was prepped and draped in the usual sterile fashion.      Clinical Data: No additional findings.   Subjective: Chief Complaint  Patient presents with   Right Knee - Pain    HPI Michelle Lynch is a 60 year old female who comes in today with right knee pain.  She reports that she was  standing at the fridge on Wednesday night when she twisted and felt a pop in her knee.  Since then she has not bear weight on the knee.  She is using crutches to ambulate.  She has increased pain in the knee with extension.  She is noted no swelling.  She states tried rest and ice.  She has not been bearing any weight on it so she is unsure of any mechanical symptoms.  No prior injury to the knee.  She denies any fevers or chills.  Denies any recent vaccines.   Review of Systems See HPI otherwise negative  Objective: Vital Signs: There were no vitals taken for this visit.  Physical Exam General: Well-developed well-nourished female no acute distress mood and affect appropriate.  Psych alert and oriented x3.  Ortho Exam Bilateral knees she has good range of motion both knees slight extension lag of the left knee approximately 2 to 3 degrees actively and passively.  This may be due to guarding.  She has no gross instability valgus varus stressing of either knee.  McMurray's is negative bilaterally.  Apley's maneuver is negative bilaterally.  Tenderness over the medial joint line right knee.  Specialty Comments:  No specialty comments available.  Imaging: XR Knee 1-2 Views Right  Result Date: 06/15/2021 Right knee 2 views: No acute fractures no  bony abnormalities.  Knee is well located.  No significant arthritic changes.    PMFS History: Patient Active Problem List   Diagnosis Date Noted   Hypercholesteremia 06/16/2017   Encounter for hepatitis C screening test for low risk patient 06/16/2017   TGA (transient global amnesia) 06/04/2017   Pain, dental    Overweight    History reviewed. No pertinent past medical history.  History reviewed. No pertinent family history.  History reviewed. No pertinent surgical history. Social History   Occupational History   Not on file  Tobacco Use   Smoking status: Never   Smokeless tobacco: Never  Substance and Sexual Activity   Alcohol use: No    Drug use: No   Sexual activity: Not on file

## 2021-06-27 ENCOUNTER — Encounter: Payer: Self-pay | Admitting: Orthopaedic Surgery

## 2021-06-27 ENCOUNTER — Ambulatory Visit (INDEPENDENT_AMBULATORY_CARE_PROVIDER_SITE_OTHER): Payer: BC Managed Care – PPO | Admitting: Orthopaedic Surgery

## 2021-06-27 ENCOUNTER — Other Ambulatory Visit: Payer: Self-pay

## 2021-06-27 DIAGNOSIS — M25561 Pain in right knee: Secondary | ICD-10-CM

## 2021-06-27 NOTE — Progress Notes (Signed)
The patient comes in today for follow-up for her right knee.  She had an acute event with that right knee with a twisting activity because significant sharp pain medially.  She also felt and heard a pop in that right knee.  At her visit 2 weeks ago we aspirated at least 35 cc or more fluid from the knee and place a steroid injection in her right knee.  She is worked on Dance movement psychotherapist exercises since then as well as activity modification.  She is been wearing a knee brace as well.  She is still having significant locking and catching with that right knee.  There is pain with flexion extension.  She is really bothered with pivoting activities of the right knee as well.  Examination of her right knee today still shows a mild effusion with warmth.  She has a grossly positive McMurray's exam to the medial compartment of the knee.  There is significant pain in the posterior medial aspect with flexion and extension of the knee as well.  Given her mechanical symptoms of locking and catching with the right knee combined with very conservative treatment including quad strengthening exercises, activity modification, wearing a knee brace as well as an aspiration of the knee and the steroid injection, a MRI of her right knee is warranted to rule out meniscal tear.  I am highly suspicious of a meniscal tear given her positive Murray's exam combined with her mechanical signs and symptoms and clinical exam findings.  Given the failed conservative treatment this is medically warranted at this standpoint as well.  We will see her back when she has the MRI of the right knee.  All questions and concerns were answered and addressed.  She will continue to limit herself in terms of pivoting activities and wearing a knee brace and working on strengthening her quad muscles.

## 2021-06-28 ENCOUNTER — Other Ambulatory Visit: Payer: Self-pay

## 2021-06-28 DIAGNOSIS — M25561 Pain in right knee: Secondary | ICD-10-CM

## 2021-07-02 ENCOUNTER — Ambulatory Visit: Payer: BC Managed Care – PPO | Admitting: Physician Assistant

## 2021-07-11 ENCOUNTER — Ambulatory Visit: Payer: BC Managed Care – PPO | Admitting: Orthopaedic Surgery

## 2021-12-19 DIAGNOSIS — J029 Acute pharyngitis, unspecified: Secondary | ICD-10-CM | POA: Diagnosis not present

## 2021-12-19 DIAGNOSIS — J0100 Acute maxillary sinusitis, unspecified: Secondary | ICD-10-CM | POA: Diagnosis not present

## 2022-01-10 DIAGNOSIS — Z1231 Encounter for screening mammogram for malignant neoplasm of breast: Secondary | ICD-10-CM | POA: Diagnosis not present

## 2022-01-10 LAB — HM MAMMOGRAPHY

## 2022-02-20 DIAGNOSIS — R21 Rash and other nonspecific skin eruption: Secondary | ICD-10-CM | POA: Diagnosis not present

## 2022-07-30 ENCOUNTER — Encounter: Payer: Self-pay | Admitting: Family

## 2022-07-30 ENCOUNTER — Telehealth: Payer: Self-pay | Admitting: Family

## 2022-07-30 ENCOUNTER — Ambulatory Visit: Payer: BC Managed Care – PPO | Admitting: Family

## 2022-07-30 VITALS — BP 125/58 | HR 85 | Temp 98.0°F | Resp 16 | Ht 67.5 in | Wt 179.0 lb

## 2022-07-30 DIAGNOSIS — Z23 Encounter for immunization: Secondary | ICD-10-CM

## 2022-07-30 DIAGNOSIS — Z1211 Encounter for screening for malignant neoplasm of colon: Secondary | ICD-10-CM | POA: Insufficient documentation

## 2022-07-30 DIAGNOSIS — E785 Hyperlipidemia, unspecified: Secondary | ICD-10-CM

## 2022-07-30 DIAGNOSIS — Z8632 Personal history of gestational diabetes: Secondary | ICD-10-CM

## 2022-07-30 DIAGNOSIS — E781 Pure hyperglyceridemia: Secondary | ICD-10-CM | POA: Diagnosis not present

## 2022-07-30 DIAGNOSIS — Z0000 Encounter for general adult medical examination without abnormal findings: Secondary | ICD-10-CM | POA: Insufficient documentation

## 2022-07-30 DIAGNOSIS — Z8349 Family history of other endocrine, nutritional and metabolic diseases: Secondary | ICD-10-CM | POA: Diagnosis not present

## 2022-07-30 NOTE — Assessment & Plan Note (Signed)
Will check TSH

## 2022-07-30 NOTE — Assessment & Plan Note (Signed)
Check A1C to assess glycemic control.

## 2022-07-30 NOTE — Progress Notes (Signed)
Subjective:   By signing my name below, I, Michelle Lynch, attest that this documentation has been prepared under the direction and in the presence of [place], NP 07/30/2022   Patient ID: Michelle Lynch, female    DOB: Dec 17, 1960, 61 y.o.   MRN: [id number]  Chief Complaint  Patient presents with   New Patient (Initial Visit)    Never had a pcp    HPI Patient is in today for establishment of patient care   Medical History: She has a history of arthritis, gestational diabetes and Transient Global Amnesia.   Surgical History: She reports of a tonsillectomy at 61 years old, a bartholin cyst removal at 61 years old and in year 10, D&C around 47 when she miscarriage. She also had a wisdom teeth removal around 1988 and incontinence surgery and uterine ablation. She does not currently work and is currently married. Her youngest daughter is the only one of her kids that live with her.   Family Medical History: She reports that her mother was diagnosed with hypertension, diabetes mellitus II, CVA, and allergies. Her maternal grandmother passed from a stroke and also had a hip fracture due to falling and was a chronic smoker. Her maternal grandfather was diagnosed with hypertension. He was also a chronic alcohol drinker. Her father has had a stroke, hypertension, macular degeneration and vascular disease  Her paternal grandmother had a stroke which caused her to pass away. Her paternal grandfather has a history of arthritis and heart block which requires him to wear a pacemaker. She has two sisters, her older sister was diagnosed with hypothyroidism and osteoarthritis. She has an older son and two younger daughters. Her younger daughter has symptoms congruent with celiac disease but is not officially diagnosed. Her older daughter is diagnosed with gestational diabetes.   Cholesterol: Her cholesterol levels are elevating.  Lab Results  Component Value Date   CHOL 263 (H) 06/16/2017    HDL 48 06/16/2017   LDLCALC 167 (H) 06/16/2017   TRIG 239 (H) 06/16/2017   CHOLHDL 5.5 (H) 06/16/2017   Colonoscopy: She reports that she has not have had a colonoscopy. She has previously done a Cologuard around 2021. She reports that her older sister had a colonoscopy last year and polyps were present  Mammogram: Last completed on 11/2021.   Pap Smear: She reports that she is UTD on pap smears.   Immunizations: She is interested in receiving a Shingles vaccine during today's visit. She has not received a tetanus vaccine. She is not interested in receiving an influenza or tetanus vaccine during today's visit.   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   Diabetes, gestational    Transient global amnesia     Past Surgical History:  Procedure Laterality Date   BARTHOLIN GLAND CYST EXCISION     [place] [age][place]   TONSILLECTOMY     age 61  uterin ablation     2005 approx   WISDOM TOOTH EXTRACTION Bilateral 1998    Family History  Problem Relation Age of Onset   Hypertension Mother  Diabetes Mellitus II Mother    Allergies Mother    CVA Mother        died from traumatic brain bleed   Hypertension Father    CVA Father    Macular degeneration Father    Vascular Disease Father        carotid stenosis   Hypothyroidism Sister    Osteoarthritis Sister    CVA Maternal Grandmother    Hip fracture Maternal Grandmother    Hypertension Maternal Grandfather    Alcohol abuse Maternal Grandfather    CVA Paternal Grandmother    Heart block Paternal Grandfather        pacemaker   Gestational diabetes Daughter    Celiac disease Daughter         questionable    Social History   Socioeconomic History   Marital status: Married    Spouse name: Not on file   Number of children: Not on file   Years of education: Not on file   Highest education level: Not on file  Occupational History   Not on file  Tobacco Use   Smoking status: Never   Smokeless tobacco: Never  Vaping Use   Vaping Use: Never used  Substance and Sexual Activity   Alcohol use: No   Drug use: No   Sexual activity: Yes    Partners: Male  Other Topics Concern   Not on file  Social History Narrative   Not currently working   Married   2 daughters (youngest still at home)   1 son    Social Determinants of Radio broadcast assistant Strain: Not on file  Food Insecurity: Not on file  Transportation Needs: Not on file  Physical Activity: Not on file  Stress: Not on file  Social Connections: Not on file  Intimate Partner Violence: Not on file    Outpatient Medications Prior to Visit  Medication Sig Dispense Refill   HYDROcodone-acetaminophen (NORCO) 5-325 MG tablet Take 1 tablet by mouth every 6 (six) hours as needed for moderate pain. One to two tabs every 4-6 hours for pain 20 tablet 0   ibuprofen (ADVIL,MOTRIN) 200 MG tablet Take 200 mg by mouth every 6 (six) hours as needed.     No facility-administered medications prior to visit.    No Known Allergies  ROS See HPI    Objective:    Physical Exam Constitutional:      General: She is not in acute distress.    Appearance: Normal appearance. She is not ill-appearing.  HENT:     Head: Normocephalic and atraumatic.     Right Ear: External ear normal.     Left Ear: External ear normal.  Eyes:     Extraocular Movements: Extraocular movements intact.     Pupils: Pupils are equal, round, and reactive to light.  Neck:     Thyroid: No thyromegaly.  Cardiovascular:     Rate and Rhythm: Normal rate and regular rhythm.     Heart sounds: Normal heart sounds. No murmur heard.    No gallop.   Pulmonary:     Effort: Pulmonary effort is normal. No respiratory distress.     Breath sounds: Normal breath sounds. No wheezing or rales.  Lymphadenopathy:     Cervical: No cervical adenopathy.  Skin:    General: Skin is warm and dry.  Neurological:     Mental Status: She is alert and oriented to person, place, and time.  Psychiatric:  Mood and Affect: Mood normal.        Behavior: Behavior normal.        Judgment: Judgment normal.     BP (!) 125/58 (BP Location: Right Arm, Patient Position: Sitting, Cuff Size: Small)   Pulse 85   Temp 98 F (36.7 C) (Oral)   Resp 16   Ht 5' 7.5" (1.715 m)   Wt 179 lb (81.2 kg)   SpO2 99%   BMI 27.62 kg/m  Wt Readings from Last 3 Encounters:  07/30/22 179 lb (81.2 kg)  06/16/17 180 lb (81.6 kg)  06/04/17 179 lb 12.8 oz (81.6 kg)       Assessment & Plan:   Problem List Items Addressed This Visit       Unprioritized   Hyperlipidemia    Strong family hx of CVA.  Will check follow up lipid panel. Consider addition of statin.       Relevant Orders   Lipid panel   Comp Met (CMET)   History of gestational diabetes    Check A1C to assess glycemic control.       Relevant Orders   Hemoglobin A1c   Family history of hypothyroidism - Primary    Will check TSH.       Relevant Orders   TSH   Colon cancer screening    She believes that she had a cologuard 2 years ago. Results reportedly negative. Will request records from her GYN.       Other Visit Diagnoses     Need for shingles vaccine       Relevant Orders   Zoster Recombinant (Shingrix ) (Completed)      No orders of the defined types were placed in this encounter.  45 minutes spent on today's visit. Time was spent interviewing patient, counseling her on preventative care and reviewing outside records.   I, Nance Pear, NP, personally preformed the services described in this documentation.  All medical record entries made by the scribe were at my  direction and in my presence.  I have reviewed the chart and discharge instructions (if applicable) and agree that the record reflects my personal performance and is accurate and complete. 07/30/2022   I,Amber Collins,acting as a Education administrator for Nance Pear, NP.,have documented all relevant documentation on the behalf of PHILICIA HEYNE, NP,as directed by  Nance Pear, NP while in the presence of Nance Pear, NP.    Nance Pear, NP

## 2022-07-30 NOTE — Telephone Encounter (Signed)
Please request cologuard results from Dr. Willis Modena, Pap smear.   Request mammogram from [facility]

## 2022-07-30 NOTE — Assessment & Plan Note (Signed)
She believes that she had a cologuard 2 years ago. Results reportedly negative. Will request records from her GYN.

## 2022-07-30 NOTE — Assessment & Plan Note (Signed)
Strong family hx of CVA.  Will check follow up lipid panel. Consider addition of statin.

## 2022-07-30 NOTE — Telephone Encounter (Signed)
Records release sent 

## 2022-07-31 ENCOUNTER — Other Ambulatory Visit: Payer: Self-pay | Admitting: Family

## 2022-07-31 DIAGNOSIS — E785 Hyperlipidemia, unspecified: Secondary | ICD-10-CM

## 2022-07-31 DIAGNOSIS — R7303 Prediabetes: Secondary | ICD-10-CM | POA: Insufficient documentation

## 2022-07-31 LAB — COMPREHENSIVE METABOLIC PANEL
ALT: 8 U/L (ref 0–35)
AST: 16 U/L (ref 0–37)
Albumin: 4.3 g/dL (ref 3.5–5.2)
Alkaline Phosphatase: 75 U/L (ref 39–117)
BUN: 10 mg/dL (ref 6–23)
CO2: 29 mEq/L (ref 19–32)
Calcium: 9.6 mg/dL (ref 8.4–10.5)
Chloride: 102 mEq/L (ref 96–112)
Creatinine, Ser: 0.71 mg/dL (ref 0.40–1.20)
GFR: 91.96 mL/min (ref >60.00)
Glucose, Bld: 104 mg/dL — ABNORMAL HIGH (ref 70–99)
Potassium: 3.9 mEq/L (ref 3.5–5.1)
Sodium: 141 mEq/L (ref 135–145)
Total Bilirubin: 0.5 mg/dL (ref 0.2–1.2)
Total Protein: 6.9 g/dL (ref 6.0–8.3)

## 2022-07-31 LAB — LIPID PANEL
Cholesterol: 242 mg/dL — ABNORMAL HIGH (ref 0–200)
HDL: 43.6 mg/dL (ref >39.00)
NonHDL: 198.87
Total CHOL/HDL Ratio: 6
Triglycerides: 265 mg/dL — ABNORMAL HIGH (ref 0.0–149.0)
VLDL: 53 mg/dL — ABNORMAL HIGH (ref 0.0–40.0)

## 2022-07-31 LAB — LDL CHOLESTEROL, DIRECT: Direct LDL: 204 mg/dL

## 2022-07-31 LAB — HEMOGLOBIN A1C: Hgb A1c MFr Bld: 6 % (ref 4.6–6.5)

## 2022-07-31 LAB — TSH: TSH: 0.86 u[IU]/mL (ref 0.35–5.50)

## 2022-07-31 NOTE — Telephone Encounter (Signed)
Cholesterol and triglycerides are elevated. Please work on low fat/low cholesterol diet and limit refined sugars and white carbs. Exercise and weight loss can also help bring these levels down.   Sugar is in the borderline diabetes range.  Our goal for her bad cholesterol is <100, but hers is 204.  This increases her stroke risk.  Given her family history, I would recommend that she begin atorvastatin 20mg  once daily to help lower her risk of heart attack and stroke. Rx pended.  Repeat lipid panel and LFT in 1 month.

## 2022-08-06 MED ORDER — ATORVASTATIN CALCIUM 20 MG PO TABS: 20.0000 mg | ORAL_TABLET | Freq: Every day | ORAL | 1 refills | Status: DC

## 2022-08-06 NOTE — Telephone Encounter (Signed)
Just wanted to make sure that you received this message.

## 2022-08-06 NOTE — Telephone Encounter (Signed)
Patient notified of results and provider's comments. She decided to follow advised, rx was sent and she was scheduled to return 09/11/22 for labs

## 2022-08-12 ENCOUNTER — Other Ambulatory Visit: Payer: BC Managed Care – PPO

## 2022-09-11 ENCOUNTER — Other Ambulatory Visit (INDEPENDENT_AMBULATORY_CARE_PROVIDER_SITE_OTHER): Payer: BC Managed Care – PPO

## 2022-09-11 DIAGNOSIS — E785 Hyperlipidemia, unspecified: Secondary | ICD-10-CM

## 2022-09-11 LAB — HEPATIC FUNCTION PANEL
ALT: 12 U/L (ref 0–35)
AST: 20 U/L (ref 0–37)
Albumin: 4.5 g/dL (ref 3.5–5.2)
Alkaline Phosphatase: 70 U/L (ref 39–117)
Bilirubin, Direct: 0.1 mg/dL (ref 0.0–0.3)
Total Bilirubin: 0.8 mg/dL (ref 0.2–1.2)
Total Protein: 7 g/dL (ref 6.0–8.3)

## 2022-09-11 LAB — LIPID PANEL
Cholesterol: 165 mg/dL (ref 0–200)
HDL: 46 mg/dL (ref >39.00)
LDL Cholesterol: 88 mg/dL (ref 0–99)
NonHDL: 119.49
Total CHOL/HDL Ratio: 4
Triglycerides: 158 mg/dL — ABNORMAL HIGH (ref 0.0–149.0)
VLDL: 31.6 mg/dL (ref 0.0–40.0)

## 2022-10-02 ENCOUNTER — Ambulatory Visit: Payer: BC Managed Care – PPO

## 2022-10-08 ENCOUNTER — Ambulatory Visit (INDEPENDENT_AMBULATORY_CARE_PROVIDER_SITE_OTHER): Payer: BC Managed Care – PPO

## 2022-10-08 DIAGNOSIS — Z23 Encounter for immunization: Secondary | ICD-10-CM

## 2022-10-08 NOTE — Progress Notes (Signed)
Michelle Lynch is a 61 y.o. female presents to the office today for Shingrix #2  injection, per physician's orders. Original order: 07/30/2022  Shingrix 0.5 mL   IM (route) was administered L (location) today. Patient tolerated injection. Patient due for follow up labs/provider appt: No.  Patient next injection due: n/a  Creft, [name]

## 2023-01-16 DIAGNOSIS — Z1231 Encounter for screening mammogram for malignant neoplasm of breast: Secondary | ICD-10-CM | POA: Diagnosis not present

## 2023-01-17 LAB — HM MAMMOGRAPHY

## 2023-02-25 ENCOUNTER — Telehealth: Payer: Self-pay | Admitting: Family

## 2023-02-25 NOTE — Telephone Encounter (Signed)
Prescription Request  02/25/2023  Is this a "Controlled Substance" medicine? No  LOV: Visit date not found  What is the name of the medication or equipment?   atorvastatin (LIPITOR) 20 MG tablet [[id number]]   Have you contacted your pharmacy to request a refill? No   Which pharmacy would you like this sent to?  [facility] - [place], [place] - [address] [place] [address] Phone: [phone] Fax: [phone]    Patient notified that their request is being sent to the clinical staff for review and that they should receive a response within 2 business days.   Please advise at Mobile [phone] (mobile)

## 2023-02-27 ENCOUNTER — Other Ambulatory Visit: Payer: Self-pay

## 2023-02-27 DIAGNOSIS — E785 Hyperlipidemia, unspecified: Secondary | ICD-10-CM

## 2023-02-27 MED ORDER — ATORVASTATIN CALCIUM 20 MG PO TABS: 20.0000 mg | ORAL_TABLET | Freq: Every day | ORAL | 1 refills | Status: DC

## 2023-02-27 NOTE — Telephone Encounter (Signed)
Rx sent and patient scheduled for physical

## 2023-03-19 ENCOUNTER — Encounter: Payer: Self-pay | Admitting: Family

## 2023-03-19 ENCOUNTER — Ambulatory Visit (INDEPENDENT_AMBULATORY_CARE_PROVIDER_SITE_OTHER): Payer: BC Managed Care – PPO | Admitting: Family

## 2023-03-19 VITALS — BP 135/69 | HR 70 | Temp 98.4°F | Resp 16 | Ht 67.7 in | Wt 178.0 lb

## 2023-03-19 DIAGNOSIS — R7303 Prediabetes: Secondary | ICD-10-CM

## 2023-03-19 DIAGNOSIS — Z1211 Encounter for screening for malignant neoplasm of colon: Secondary | ICD-10-CM

## 2023-03-19 DIAGNOSIS — Z0000 Encounter for general adult medical examination without abnormal findings: Secondary | ICD-10-CM | POA: Diagnosis not present

## 2023-03-19 DIAGNOSIS — E785 Hyperlipidemia, unspecified: Secondary | ICD-10-CM

## 2023-03-19 LAB — LIPID PANEL
Cholesterol: 155 mg/dL (ref 0–200)
HDL: 46.8 mg/dL (ref >39.00)
LDL Cholesterol: 82 mg/dL (ref 0–99)
NonHDL: 108.02
Total CHOL/HDL Ratio: 3
Triglycerides: 131 mg/dL (ref 0.0–149.0)
VLDL: 26.2 mg/dL (ref 0.0–40.0)

## 2023-03-19 LAB — COMPREHENSIVE METABOLIC PANEL
ALT: 13 U/L (ref 0–35)
AST: 20 U/L (ref 0–37)
Albumin: 4.4 g/dL (ref 3.5–5.2)
Alkaline Phosphatase: 64 U/L (ref 39–117)
BUN: 12 mg/dL (ref 6–23)
CO2: 27 mEq/L (ref 19–32)
Calcium: 9.4 mg/dL (ref 8.4–10.5)
Chloride: 104 mEq/L (ref 96–112)
Creatinine, Ser: 0.63 mg/dL (ref 0.40–1.20)
GFR: 95.52 mL/min (ref >60.00)
Glucose, Bld: 87 mg/dL (ref 70–99)
Potassium: 4.1 mEq/L (ref 3.5–5.1)
Sodium: 140 mEq/L (ref 135–145)
Total Bilirubin: 0.8 mg/dL (ref 0.2–1.2)
Total Protein: 6.9 g/dL (ref 6.0–8.3)

## 2023-03-19 LAB — HEMOGLOBIN A1C: Hgb A1c MFr Bld: 6.3 % (ref 4.6–6.5)

## 2023-03-19 NOTE — Assessment & Plan Note (Signed)
Check follow up A1C.  

## 2023-03-19 NOTE — Progress Notes (Signed)
Subjective:   By signing my name below, I, Michelle Lynch, attest that this documentation has been prepared under the direction and in the presence of [name], NP. 03/19/2023   Patient ID: Michelle Lynch, female    DOB: October 17, 1961, 62 y.o.   MRN: [id number]  Chief Complaint  Patient presents with   Annual Exam    HPI Patient is in today for an annual exam.   A1C: Her A1C was slightly elevated during her last blood work.  Lab Results  Component Value Date   HGBA1C 6.0 07/30/2022    Cholesterol: Her cholesterol was elevated during her last blood work. She is taking 20 mg Lipitor daily PO to manage it.  Lab Results  Component Value Date   CHOL 165 09/11/2022   HDL 46.00 09/11/2022   LDLCALC 88 09/11/2022   LDLDIRECT 204.0 07/30/2022   TRIG 158.0 (H) 09/11/2022   CHOLHDL 4 09/11/2022     Acute: She denies fever, unexpected weight change, adenopathy, new moles, sinus pain, sore throat, visual disturbance, chest pain, palpitations, leg swelling, cough, shortness of breath, wheezing, nausea, vomiting, diarrhea, constipation, blood in stool, dysuria, frequency, hematuria, new muscle pain, new joint pain, headaches, depression or anxiety at this time.   Pap Smear: Last pap smear completed on 01/12/2020. Results are normal. Repeat in 3 years.   Mammogram: Last mammogram completed on 01/10/2022. Results are normal. Repeat in 1 year.   Social history: No recent surgeries. No changes in family medical history. She does not consume alcohol or use drugs. She does not smoke tobacco or use vaping products. She has a female partner.   Immunizations: She is not interested in the tetanus immunization. She is UTD on her shingles immunizations.   Dental: She is UTD on dental.  Vision:  She is UTD on vision.  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   Diabetes, gestational    Transient global amnesia     Past Surgical History:  Procedure Laterality Date    BARTHOLIN GLAND CYST EXCISION     age 59 and again in 13   DILATATION AND CURETTAGE/HYSTEROSCOPY WITH MINERVA  1991   after misscarriage   INCONTINENCE SURGERY  2005   TONSILLECTOMY     age 42   uterin ablation     2005 approx   WISDOM TOOTH EXTRACTION Bilateral 1998    Family History  Problem Relation Age of Onset   Hypertension Mother    Diabetes Mellitus II Mother    Allergies Mother    CVA Mother        died from traumatic brain bleed   Hypertension Father    CVA Father    Macular degeneration Father    Vascular Disease Father        carotid stenosis   Hypothyroidism Sister    Osteoarthritis Sister    CVA Maternal Grandmother    Hip fracture Maternal Grandmother    Hypertension Maternal Grandfather    Alcohol abuse Maternal Grandfather    CVA Paternal Grandmother    Heart block Paternal Grandfather        pacemaker   Gestational diabetes Daughter    Celiac disease Daughter        questionable    Social History   Socioeconomic History   Marital status: Married    Spouse name: Not on file   Number of children: Not on file   Years of education: Not on file  Highest education level: Not on file  Occupational History   Not on file  Tobacco Use   Smoking status: Never   Smokeless tobacco: Never  Vaping Use   Vaping Use: Never used  Substance and Sexual Activity   Alcohol use: No   Drug use: No   Sexual activity: Yes    Partners: Male  Other Topics Concern   Not on file  Social History Narrative   Not currently working   Married   2 daughters (youngest still at home)   1 son    Social Determinants of Corporate investment banker Strain: Not on file  Food Insecurity: Not on file  Transportation Needs: Not on file  Physical Activity: Not on file  Stress: Not on file  Social Connections: Not on file  Intimate Partner Violence: Not on file    Outpatient Medications Prior to Visit  Medication Sig Dispense Refill   atorvastatin (LIPITOR) 20 MG  tablet Take 1 tablet (20 mg total) by mouth daily. 90 tablet 1   No facility-administered medications prior to visit.    No Known Allergies  Review of Systems  Constitutional:  Negative for fever.       (-) unexpected weight change  HENT:  Negative for sinus pain and sore throat.   Eyes:        (-) visual disturbance  Respiratory:  Negative for cough, shortness of breath and wheezing.   Cardiovascular:  Negative for chest pain, palpitations and leg swelling.  Gastrointestinal:  Negative for blood in stool, constipation, diarrhea, nausea and vomiting.  Genitourinary:  Negative for dysuria, frequency and hematuria.  Musculoskeletal:        (-) new joint pain (-) new muscle pain  Skin:        (-) new moles  Neurological:  Negative for headaches.  Psychiatric/Behavioral:  Negative for depression.        (-) anxiety       Objective:    Physical Exam Constitutional:      General: She is not in acute distress.    Appearance: Normal appearance.  HENT:     Head: Normocephalic and atraumatic.     Right Ear: Tympanic membrane, ear canal and external ear normal.     Left Ear: Tympanic membrane, ear canal and external ear normal.  Eyes:     Extraocular Movements: Extraocular movements intact.     Right eye: No nystagmus.     Left eye: No nystagmus.     Pupils: Pupils are equal, round, and reactive to light.  Cardiovascular:     Rate and Rhythm: Normal rate and regular rhythm.     Heart sounds: Normal heart sounds. No murmur heard.    No gallop.  Pulmonary:     Effort: Pulmonary effort is normal. No respiratory distress.     Breath sounds: Normal breath sounds. No wheezing or rales.  Abdominal:     General: Bowel sounds are normal. There is no distension.     Palpations: Abdomen is soft.     Tenderness: There is no abdominal tenderness. There is no guarding.  Musculoskeletal:     Cervical back: Normal range of motion.     Comments: (+) 5/5 strength in upper and lower  extremities  Lymphadenopathy:     Cervical: No cervical adenopathy.  Skin:    General: Skin is warm.  Neurological:     Mental Status: She is alert and oriented to person, place, and time.  Deep Tendon Reflexes:     Reflex Scores:      Patellar reflexes are 2+ on the right side and 2+ on the left side. Psychiatric:        Judgment: Judgment normal.     BP 135/69 (BP Location: Right Arm, Patient Position: Sitting, Cuff Size: Small)   Pulse 70   Temp 98.4 F (36.9 C) (Oral)   Resp 16   Ht 5' 7.7" (1.72 m)   Wt 178 lb (80.7 kg)   SpO2 98%   BMI 27.31 kg/m  Wt Readings from Last 3 Encounters:  03/19/23 178 lb (80.7 kg)  07/30/22 179 lb (81.2 kg)  06/16/17 180 lb (81.6 kg)       Assessment & Plan:  Preventative health care Assessment & Plan: She will call GYN to schedule pap, also looking for a new dentist. She is agreeable to colonoscopy- order placed. Declines tetanus, mammo up to date and will be requested from [facility].   Orders: -     CBC with Differential/Platelet; Future  Hyperlipidemia, unspecified hyperlipidemia type Assessment & Plan: Tolerating statin, will obtain follow up lipid panel.  Orders: -     Comprehensive metabolic panel -     Lipid panel  Screening for colon cancer -     Ambulatory referral to Gastroenterology  Borderline diabetes Assessment & Plan: Check follow up A1C.   Orders: -     Hemoglobin A1c    I, Lemont Fillers, NP, personally preformed the services described in this documentation.  All medical record entries made by the scribe were at my direction and in my presence.  I have reviewed the chart and discharge instructions (if applicable) and agree that the record reflects my personal performance and is accurate and complete. 03/19/2023  Lemont Fillers, NP  [facility] as a scribe for Lemont Fillers, NP.,have documented all relevant documentation on the behalf of ADELENA DEVEGA, NP,as directed  by  Lemont Fillers, NP while in the presence of Lemont Fillers, NP.

## 2023-03-19 NOTE — Assessment & Plan Note (Signed)
Tolerating statin, will obtain follow up lipid panel.

## 2023-03-19 NOTE — Assessment & Plan Note (Signed)
She will call GYN to schedule pap, also looking for a new dentist. She is agreeable to colonoscopy- order placed. Declines tetanus, mammo up to date and will be requested from [facility].

## 2023-04-01 ENCOUNTER — Other Ambulatory Visit: Payer: Self-pay | Admitting: Family

## 2023-04-01 DIAGNOSIS — E785 Hyperlipidemia, unspecified: Secondary | ICD-10-CM

## 2023-06-18 DIAGNOSIS — R319 Hematuria, unspecified: Secondary | ICD-10-CM | POA: Insufficient documentation

## 2023-07-02 ENCOUNTER — Ambulatory Visit (AMBULATORY_SURGERY_CENTER): Payer: BC Managed Care – PPO

## 2023-07-02 VITALS — Ht 67.0 in | Wt 165.4 lb

## 2023-07-02 DIAGNOSIS — Z1211 Encounter for screening for malignant neoplasm of colon: Secondary | ICD-10-CM

## 2023-07-02 MED ORDER — NA SULFATE-K SULFATE-MG SULF 17.5-3.13-1.6 GM/177ML PO SOLN: 1.0000 | Freq: Once | ORAL | 0 refills | Status: AC

## 2023-07-02 NOTE — Progress Notes (Signed)
No egg or soy allergy known to patient  No issues known to pt with past sedation with any surgeries or procedures Patient denies ever being told they had issues or difficulty with intubation  No FH of Malignant Hyperthermia Pt is not on diet pills Pt is not on  home 02  Pt is not on blood thinners  Pt denies issues with constipation  No A fib or A flutter Have any cardiac testing pending-- no  LOA: independent  Prep: suprep   Patient's chart reviewed by Cathlyn Parsons CNRA prior to previsit and patient appropriate for the [facility].  Previsit completed and red dot placed by patient's name on their procedure day (on provider's schedule).     PV competed with patient. Prep instructions sent via mychart and hard copy given at visit

## 2023-07-21 ENCOUNTER — Encounter: Payer: BC Managed Care – PPO | Admitting: Gastroenterology

## 2023-07-30 ENCOUNTER — Encounter: Payer: BC Managed Care – PPO | Admitting: Gastroenterology

## 2023-08-01 ENCOUNTER — Other Ambulatory Visit (INDEPENDENT_AMBULATORY_CARE_PROVIDER_SITE_OTHER): Payer: BC Managed Care – PPO

## 2023-08-01 ENCOUNTER — Ambulatory Visit: Payer: BC Managed Care – PPO | Admitting: Physician Assistant

## 2023-08-01 ENCOUNTER — Encounter: Payer: Self-pay | Admitting: Physician Assistant

## 2023-08-01 DIAGNOSIS — R0781 Pleurodynia: Secondary | ICD-10-CM | POA: Diagnosis not present

## 2023-08-01 DIAGNOSIS — S2239XA Fracture of one rib, unspecified side, initial encounter for closed fracture: Secondary | ICD-10-CM | POA: Insufficient documentation

## 2023-08-01 DIAGNOSIS — S2231XA Fracture of one rib, right side, initial encounter for closed fracture: Secondary | ICD-10-CM | POA: Diagnosis not present

## 2023-08-01 MED ORDER — HYDROCODONE-ACETAMINOPHEN 5-325 MG PO TABS: 1.0000 | ORAL_TABLET | ORAL | 0 refills | Status: DC | PRN

## 2023-08-01 NOTE — Progress Notes (Signed)
Office Visit Note   Patient: Michelle Lynch           Date of Birth: 02-27-1961           MRN: [id number] Visit Date: 08/01/2023              Requested by: [name], NP [address] [place],  [place] [address] PCP: [name], NP   Assessment & Plan: Visit Diagnoses:  1. Rib pain on right side   2. Closed fracture of one rib of right side, initial encounter     Plan: Michelle Lynch is a pleasant healthy 62 year old woman who is 6 days status post being pulled down on her right side by her dogs.  Denies any loss of consciousness any shortness of breath.  Presents today with isolated right rib pain.  X-rays do demonstrate a fracture of the fourth rib in the midaxillary line.  No sign of a pneumothorax.  No shortness of breath.  Explained to her the natural history of this.  She is taking ibuprofen and Tylenol.  She is going on a cruise.  Will give her a few hydrocodone just to take in case of a rescue medication.  She understands that any fever, chills, shortness of breath or pain with breathing warrants immediate evaluation.  Will follow-up in 2 weeks for new x-rays.  Emphasized the importance of deep breathing to keep her lungs clear  Follow-Up Instructions: Return in about 2 weeks (around 08/15/2023).   Orders:  Orders Placed This Encounter  Procedures   XR Ribs Unilateral Right   Meds ordered this encounter  Medications   HYDROcodone-acetaminophen (NORCO/VICODIN) 5-325 MG tablet    Sig: Take 1 tablet by mouth every 4 (four) hours as needed for moderate pain.    Dispense:  20 tablet    Refill:  0      Procedures: No procedures performed   Clinical Data: No additional findings.   Subjective: Chief Complaint  Patient presents with   ribs    Pain on right lateral side    HPI Michelle Lynch is a pleasant 62 year old woman who has been seen by Michelle Lynch in the past.  She presents with a 6-day history of focal rib pain in her mid axillary line after  being pulled down by her dogs.  Denies any loss of consciousness denies any paresthesias or radicular symptoms.  Denies any fever or chills.  Rates her pain as moderate to severe especially when trying to change positions like getting out of bed  Review of Systems  All other systems reviewed and are negative.    Objective: Vital Signs: There were no vitals taken for this visit.  Physical Exam Constitutional:      Appearance: Normal appearance.  Pulmonary:     Effort: Pulmonary effort is normal.  Skin:    General: Skin is warm and dry.  Neurological:     General: No focal deficit present.     Mental Status: She is alert and oriented to person, place, and time.     Ortho Exam Examination patient is self ambulating but is bracing her right side with her arm.  Normal respiratory effort and excursion.  No fever or chills noted subjectively.  Has full range of motion of her neck with just some slight stiffness when turning to the left.  No tenderness or step-offs in her cervical spine.  She is neurovascularly intact with good strength distally.  She is focally tender in the  mid axillary line to palpation no crepitus appreciated Specialty Comments:  No specialty comments available.  Imaging: XR Ribs Unilateral Right  Result Date: 08/01/2023 Radiographs of her right ribs demonstrate well-maintained alignment vascular markings go to the periphery without evidence of pneumothorax.  She does have a fracture of the fourth rib in the midaxillary line    PMFS History: Patient Active Problem List   Diagnosis Date Noted   Rib fracture 08/01/2023   Blood in urine 06/18/2023   Borderline diabetes 07/31/2022   History of gestational diabetes 07/30/2022   Family history of hypothyroidism 07/30/2022   Preventative health care 07/30/2022   Colon cancer screening 07/30/2022   Hyperlipidemia 06/16/2017   Encounter for hepatitis C screening test for low risk patient 06/16/2017   TGA (transient  global amnesia) 06/04/2017   Pain, dental    Overweight    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   Diabetes, gestational    Transient global amnesia     Family History  Problem Relation Age of Onset   Hypertension Mother    Diabetes Mellitus II Mother    Allergies Mother    CVA Mother        died from traumatic brain bleed   Hypertension Father    CVA Father    Macular degeneration Father    Vascular Disease Father        carotid stenosis   Hypothyroidism Sister    Osteoarthritis Sister    Crohn's disease Paternal Aunt    CVA Maternal Grandmother    Hip fracture Maternal Grandmother    Hypertension Maternal Grandfather    Alcohol abuse Maternal Grandfather    CVA Paternal Grandmother    Heart block Paternal Grandfather        pacemaker   Gestational diabetes Daughter    Celiac disease Daughter        questionable   Colon cancer Neg Hx    Colon polyps Neg Hx    Esophageal cancer Neg Hx    Rectal cancer Neg Hx    Stomach cancer Neg Hx     Past Surgical History:  Procedure Laterality Date   BARTHOLIN GLAND CYST EXCISION     age 53 and again in 4   DILATATION AND CURETTAGE/HYSTEROSCOPY WITH MINERVA  1991   after misscarriage   INCONTINENCE SURGERY  2005   TONSILLECTOMY     age 67   uterin ablation     2005 approx   WISDOM TOOTH EXTRACTION Bilateral 1998   Social History   Occupational History   Not on file  Tobacco Use   Smoking status: Never   Smokeless tobacco: Never  Vaping Use   Vaping status: Never Used  Substance and Sexual Activity   Alcohol use: No   Drug use: No   Sexual activity: Yes    Partners: Male

## 2023-08-19 ENCOUNTER — Encounter: Payer: Self-pay | Admitting: Gastroenterology

## 2023-09-02 ENCOUNTER — Encounter: Payer: BC Managed Care – PPO | Admitting: Gastroenterology

## 2023-09-21 ENCOUNTER — Other Ambulatory Visit: Payer: Self-pay | Admitting: Family

## 2023-09-21 DIAGNOSIS — E785 Hyperlipidemia, unspecified: Secondary | ICD-10-CM

## 2023-11-25 ENCOUNTER — Ambulatory Visit: Payer: BC Managed Care – PPO

## 2023-11-25 VITALS — Ht 67.5 in | Wt 160.0 lb

## 2023-11-25 DIAGNOSIS — Z1211 Encounter for screening for malignant neoplasm of colon: Secondary | ICD-10-CM

## 2023-11-25 NOTE — Progress Notes (Signed)
 No egg or soy allergy known to patient  No issues known to pt with past sedation with any surgeries or procedures Patient denies ever being told they had issues or difficulty with intubation  No FH of Malignant Hyperthermia Pt is not on diet pills Pt is not on  home 02  Pt is not on blood thinners  Pt denies issues with constipation  No A fib or A flutter Have any cardiac testing pending--no Pt instructed to use Single[employer].com or GoodRx for a price reduction on prep  Ambulates independently Patient's chart reviewed by Cathlyn Parsons CNRA prior to previsit and patient appropriate for the [facility].  Previsit completed and red dot placed by patient's name on their procedure day (on provider's schedule).

## 2023-12-05 ENCOUNTER — Encounter: Payer: Self-pay | Admitting: Gastroenterology

## 2023-12-09 ENCOUNTER — Ambulatory Visit: Payer: BC Managed Care – PPO | Admitting: Gastroenterology

## 2023-12-09 ENCOUNTER — Encounter: Payer: Self-pay | Admitting: Gastroenterology

## 2023-12-09 VITALS — BP 100/58 | HR 74 | Temp 98.0°F | Resp 20 | Ht 67.0 in | Wt 160.0 lb

## 2023-12-09 DIAGNOSIS — K5730 Diverticulosis of large intestine without perforation or abscess without bleeding: Secondary | ICD-10-CM | POA: Diagnosis not present

## 2023-12-09 DIAGNOSIS — D125 Benign neoplasm of sigmoid colon: Secondary | ICD-10-CM | POA: Diagnosis not present

## 2023-12-09 DIAGNOSIS — Z1211 Encounter for screening for malignant neoplasm of colon: Secondary | ICD-10-CM

## 2023-12-09 MED ORDER — SODIUM CHLORIDE 0.9 % IV SOLN: 500.0000 mL | Freq: Once | INTRAVENOUS | Status: DC

## 2023-12-09 NOTE — Progress Notes (Signed)
Pt's states no medical or surgical changes since previsit or office visit.

## 2023-12-09 NOTE — Patient Instructions (Signed)
Resume previous diet and medications. Awaiting pathology results. Repeat Colonoscopy date to be determined based on pathology results. Handouts given ZO:XWRUE polyps and Diverticulosis  YOU HAD AN ENDOSCOPIC PROCEDURE TODAY AT THE [facility]:   Refer to the procedure report that was given to you for any specific questions about what was found during the examination.  If the procedure report does not answer your questions, please call your gastroenterologist to clarify.  If you requested that your care partner not be given the details of your procedure findings, then the procedure report has been included in a sealed envelope for you to review at your convenience later.  YOU SHOULD EXPECT: Some feelings of bloating in the abdomen. Passage of more gas than usual.  Walking can help get rid of the air that was put into your GI tract during the procedure and reduce the bloating. If you had a lower endoscopy (such as a colonoscopy or flexible sigmoidoscopy) you may notice spotting of blood in your stool or on the toilet paper. If you underwent a bowel prep for your procedure, you may not have a normal bowel movement for a few days.  Please Note:  You might notice some irritation and congestion in your nose or some drainage.  This is from the oxygen used during your procedure.  There is no need for concern and it should clear up in a day or so.  SYMPTOMS TO REPORT IMMEDIATELY:  Following lower endoscopy (colonoscopy or flexible sigmoidoscopy):  Excessive amounts of blood in the stool  Significant tenderness or worsening of abdominal pains  Swelling of the abdomen that is new, acute  Fever of 100F or higher  For urgent or emergent issues, a gastroenterologist can be reached at any hour by calling [phone]. Do not use MyChart messaging for urgent concerns.    DIET:  We do recommend a small meal at first, but then you may proceed to your regular diet.  Drink plenty of fluids but you  should avoid alcoholic beverages for 24 hours.  ACTIVITY:  You should plan to take it easy for the rest of today and you should NOT DRIVE or use heavy machinery until tomorrow (because of the sedation medicines used during the test).    FOLLOW UP: Our staff will call the number listed on your records the next business day following your procedure.  We will call around 7:15- 8:00 am to check on you and address any questions or concerns that you may have regarding the information given to you following your procedure. If we do not reach you, we will leave a message.     If any biopsies were taken you will be contacted by phone or by letter within the next 1-3 weeks.  Please call us at [phone] if you have not heard about the biopsies in 3 weeks.    SIGNATURES/CONFIDENTIALITY: You and/or your care partner have signed paperwork which will be entered into your electronic medical record.  These signatures attest to the fact that that the information above on your After Visit Summary has been reviewed and is understood.  Full responsibility of the confidentiality of this discharge information lies with you and/or your care-partner.

## 2023-12-09 NOTE — Progress Notes (Signed)
Sedate, gd SR, tolerated procedure well, VSS, report to RN

## 2023-12-09 NOTE — Progress Notes (Signed)
Called to room to assist during endoscopic procedure.  Patient ID and intended procedure confirmed with present staff. Received instructions for my participation in the procedure from the performing physician.

## 2023-12-09 NOTE — Progress Notes (Signed)
GASTROENTEROLOGY PROCEDURE H&P NOTE   Primary Care Physician: [name], NP    Reason for Procedure:  Colon Cancer screening  Plan:    Colonoscopy  Patient is appropriate for endoscopic procedure(s) in the ambulatory ([facility]) setting.  The nature of the procedure, as well as the risks, benefits, and alternatives were carefully and thoroughly reviewed with the patient. Ample time for discussion and questions allowed. The patient understood, was satisfied, and agreed to proceed.     HPI: Michelle Lynch is a 63 y.o. female who presents for colonoscopy for routine Colon Cancer screening.  No active GI symptoms.  No known family history of colon cancer or related malignancy.  Patient is otherwise without complaints or active issues today.  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 Authorizing Provider  atorvastatin (LIPITOR) 20 MG tablet Take 1 tablet (20 mg total) by mouth daily. 09/21/23  Yes Worthy Rancher B, FNP    Current Outpatient Medications  Medication Sig Dispense Refill   atorvastatin (LIPITOR) 20 MG tablet Take 1 tablet (20 mg total) by mouth daily. 90 tablet 1   Current Facility-Administered Medications  Medication Dose Route Frequency Provider Last Rate Last Admin   0.9 %  sodium chloride infusion  500 mL Intravenous Once Jovany Disano V, DO        Allergies as of 12/09/2023   (No Known Allergies)    Family History  Problem Relation  Age of Onset   Hypertension Mother    Diabetes Mellitus II Mother    Allergies Mother    CVA Mother        died from traumatic brain bleed   Hypertension Father    CVA Father    Macular degeneration Father    Vascular Disease Father        carotid stenosis   Hypothyroidism Sister    Osteoarthritis Sister    Crohn's disease Paternal Aunt    CVA Maternal Grandmother    Hip fracture Maternal Grandmother    Hypertension Maternal Grandfather    Alcohol abuse Maternal Grandfather    CVA Paternal Grandmother    Heart block Paternal Grandfather        pacemaker   Gestational diabetes Daughter    Celiac disease Daughter        questionable   Colon cancer Neg Hx    Colon polyps Neg Hx    Esophageal cancer Neg Hx    Rectal cancer Neg Hx    Stomach cancer Neg Hx     Social History   Socioeconomic History   Marital status: Married    Spouse name: Not on file   Number of children: Not on file   Years of education: Not on file   Highest education level: Not on file  Occupational History   Not on file  Tobacco Use   Smoking status:  Never   Smokeless tobacco: Never  Vaping Use   Vaping status: Never Used  Substance and Sexual Activity   Alcohol use: No   Drug use: No   Sexual activity: Yes    Partners: Male  Other Topics Concern   Not on file  Social History Narrative   Not currently working   Married   2 daughters (youngest still at home)   1 son    Social Drivers of Corporate investment banker Strain: Not on file  Food Insecurity: Not on file  Transportation Needs: Not on file  Physical Activity: Not on file  Stress: Not on file  Social Connections: Not on file  Intimate Partner Violence: Not on file    Physical Exam: Vital signs in last 24 hours: @BP  126/70   Pulse 80   Temp 98 F (36.7 C)   Ht 5\' 7"  (1.702 m)   Wt 160 lb (72.6 kg)   SpO2 100%   BMI 25.06 kg/m  GEN: NAD EYE: Sclerae anicteric ENT: MMM CV: Non-tachycardic Pulm: CTA b/l GI: Soft,  NT/ND NEURO:  Alert & Oriented x 3   Doristine Locks, DO [facility]   12/09/2023 12:54 PM

## 2023-12-09 NOTE — Op Note (Signed)
[facility] Patient Name: Michelle Lynch Procedure Date: 12/09/2023 12:56 PM MRN: [id number] Endoscopist: Doristine Locks , MD, [id number] Age: 63 Referring MD:  Date of Birth: 02/20/1961 Gender: Female Account #: [id number] Procedure:                Colonoscopy Indications:              Screening for colorectal malignant neoplasm, This                            is the patient's first colonoscopy Medicines:                Monitored Anesthesia Care Procedure:                Pre-Anesthesia Assessment:                           - Prior to the procedure, a History and Physical                            was performed, and patient medications and                            allergies were reviewed. The patient's tolerance of                            previous anesthesia was also reviewed. The risks                            and benefits of the procedure and the sedation                            options and risks were discussed with the patient.                            All questions were answered, and informed consent                            was obtained. Prior Anticoagulants: The patient has                            taken no anticoagulant or antiplatelet agents. ASA                            Grade Assessment: I - A normal, healthy patient.                            After reviewing the risks and benefits, the patient                            was deemed in satisfactory condition to undergo the                            procedure.  After obtaining informed consent, the colonoscope                            was passed under direct vision. Throughout the                            procedure, the patient's blood pressure, pulse, and                            oxygen saturations were monitored continuously. The                            CF [id number] #[id number] was introduced through the anus                            and advanced to the the terminal  ileum. The                            colonoscopy was performed without difficulty. The                            patient tolerated the procedure well. The quality                            of the bowel preparation was good. The terminal                            ileum, ileocecal valve, appendiceal orifice, and                            rectum were photographed. Scope In: 1:06:39 PM Scope Out: 1:26:53 PM Scope Withdrawal Time: 0 hours 13 minutes 57 seconds  Total Procedure Duration: 0 hours 20 minutes 14 seconds  Findings:                 The perianal and digital rectal examinations were                            normal.                           A 4 mm polyp was found in the distal sigmoid colon.                            The polyp was sessile. The polyp was removed with a                            cold snare. Resection and retrieval were complete.                            Estimated blood loss was minimal.                           Multiple medium-mouthed and small-mouthed  diverticula were found in the sigmoid colon.                           The exam was otherwise normal throughout the                            remainder of the colon.                           The retroflexed view of the distal rectum and anal                            verge was normal and showed no anal or rectal                            abnormalities.                           The terminal ileum appeared normal. Complications:            No immediate complications. Estimated Blood Loss:     Estimated blood loss was minimal. Impression:               - One 4 mm polyp in the distal sigmoid colon,                            removed with a cold snare. Resected and retrieved.                           - Diverticulosis in the sigmoid colon.                           - The distal rectum and anal verge are normal on                            retroflexion view.                            - The examined portion of the ileum was normal. Recommendation:           - Patient has a contact number available for                            emergencies. The signs and symptoms of potential                            delayed complications were discussed with the                            patient. Return to normal activities tomorrow.                            Written discharge instructions were provided to the  patient.                           - Resume previous diet.                           - Continue present medications.                           - Await pathology results.                           - Repeat colonoscopy for surveillance based on                            pathology results.                           - Return to GI office PRN. Doristine Locks, MD 12/09/2023 1:31:24 PM

## 2023-12-10 ENCOUNTER — Telehealth: Payer: Self-pay

## 2023-12-10 NOTE — Telephone Encounter (Signed)
  Follow up Call-     12/09/2023   12:43 PM  Call back number  Post procedure Call Back phone  # [phone]  Permission to leave phone message No     Patient questions:  Do you have a fever, pain , or abdominal swelling? No. Pain Score  0 *  Have you tolerated food without any problems? Yes.    Have you been able to return to your normal activities? Yes.    Do you have any questions about your discharge instructions: Diet   No. Medications  No. Follow up visit  No.  Do you have questions or concerns about your Care? No.  Actions: * If pain score is 4 or above: No action needed, pain <4.

## 2023-12-11 LAB — SURGICAL PATHOLOGY

## 2023-12-18 ENCOUNTER — Encounter: Payer: Self-pay | Admitting: Gastroenterology

## 2024-01-06 DIAGNOSIS — Z124 Encounter for screening for malignant neoplasm of cervix: Secondary | ICD-10-CM | POA: Diagnosis not present

## 2024-01-06 DIAGNOSIS — Z01419 Encounter for gynecological examination (general) (routine) without abnormal findings: Secondary | ICD-10-CM | POA: Diagnosis not present

## 2024-01-06 DIAGNOSIS — Z1151 Encounter for screening for human papillomavirus (HPV): Secondary | ICD-10-CM | POA: Diagnosis not present

## 2024-01-22 DIAGNOSIS — Z1231 Encounter for screening mammogram for malignant neoplasm of breast: Secondary | ICD-10-CM | POA: Diagnosis not present

## 2024-01-22 LAB — HM MAMMOGRAPHY

## 2024-04-12 ENCOUNTER — Other Ambulatory Visit: Payer: Self-pay | Admitting: Family

## 2024-04-12 DIAGNOSIS — E785 Hyperlipidemia, unspecified: Secondary | ICD-10-CM

## 2024-04-12 NOTE — Telephone Encounter (Signed)
 Please contact pt to schedule follow up.

## 2024-04-27 ENCOUNTER — Encounter: Payer: Self-pay | Admitting: Family

## 2024-04-27 ENCOUNTER — Ambulatory Visit (INDEPENDENT_AMBULATORY_CARE_PROVIDER_SITE_OTHER): Admitting: Family

## 2024-04-27 ENCOUNTER — Telehealth: Payer: Self-pay | Admitting: Family

## 2024-04-27 VITALS — BP 114/60 | HR 73 | Temp 98.7°F | Resp 16 | Ht 67.0 in | Wt 166.0 lb

## 2024-04-27 DIAGNOSIS — R7303 Prediabetes: Secondary | ICD-10-CM | POA: Diagnosis not present

## 2024-04-27 DIAGNOSIS — E785 Hyperlipidemia, unspecified: Secondary | ICD-10-CM

## 2024-04-27 DIAGNOSIS — Z780 Asymptomatic menopausal state: Secondary | ICD-10-CM | POA: Diagnosis not present

## 2024-04-27 DIAGNOSIS — Z0000 Encounter for general adult medical examination without abnormal findings: Secondary | ICD-10-CM

## 2024-04-27 MED ORDER — ROSUVASTATIN CALCIUM 10 MG PO TABS: 10.0000 mg | ORAL_TABLET | Freq: Every day | ORAL | 1 refills | Status: DC

## 2024-04-27 NOTE — Patient Instructions (Addendum)
 VISIT SUMMARY:  Today, you had your annual physical exam. We discussed your muscle pain, cholesterol management, and prediabetes. We also reviewed your general health maintenance and recommended some tests and vaccinations.  YOUR PLAN:  HYPERLIPIDEMIA: Your cholesterol is well-controlled, but you experience muscle pain, which may be related to your current medication. -Switch to Crestor 10 mg daily. -We will check your cholesterol levels again in 3 months with a lipid panel.  PREDIABETES: Your HbA1c level indicates prediabetes, and you have a family history of diabetes and cardiovascular disease. -Continue with lifestyle modifications to manage your blood sugar.  GENERAL HEALTH MAINTENANCE: You are generally healthy and active, and you are up to date on most of your screenings. -We will order a bone density test if it is covered by your insurance. -Schedule your next annual physical in 1 year.

## 2024-04-27 NOTE — Progress Notes (Signed)
 Subjective:     Patient ID: Michelle Lynch, female    DOB: Sep 26, 1961, 63 y.o.   MRN: [id number]  Chief Complaint  Patient presents with   Annual Exam    HPI  Discussed the use of AI scribe software for clinical note transcription with the patient, who gave verbal consent to proceed.  History of Present Illness  Michelle Lynch is a 63 year old female who presents for an annual physical exam.  She sustained rib fractures last September after being pulled over by her daughter's dogs. She experiences muscle pain, which she associates with her cholesterol medication, and takes it intermittently. She reports burning pain in her fingers and occasional pain in her toes, attributing these to the medication.  Her family history includes strokes, with her grandmother, father's mother, and mother having died from strokes. She is aware of her borderline diabetes, with a previous A1c of 6.3, and has made dietary changes, such as reducing sweet tea and [place] fries.  She is physically active, often walking her daughter's dogs and caring for her grandchildren. She has not had a bone density test before. Her Pap smear three to four months ago was normal, and she had a mammogram around March 2024. She is up to date with dental exams but has not had a vision exam in the last year. No current cough, cold symptoms, or skin issues. Hearing and vision are okay.  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    Diabetes, gestational    Transient global amnesia    during orthodontic procedure    Past Surgical History:  Procedure Laterality Date   BARTHOLIN GLAND CYST EXCISION     age 28 and again in 41   DILATATION AND CURETTAGE/HYSTEROSCOPY WITH MINERVA  1991   after misscarriage   INCONTINENCE SURGERY  2005   TONSILLECTOMY     age 20   uterin ablation     2005 approx   WISDOM TOOTH EXTRACTION Bilateral 1998    Family History  Problem Relation Age of Onset   Hypertension Mother    Diabetes Mellitus II Mother    Allergies Mother    CVA Mother        died from traumatic brain bleed   Hypertension Father    CVA Father    Macular degeneration Father    Vascular Disease Father        carotid stenosis   Hypothyroidism Sister    Osteoarthritis Sister    Crohn's disease Paternal Aunt    CVA Maternal Grandmother    Hip fracture Maternal Grandmother    Hypertension Maternal Grandfather    Alcohol abuse Maternal Grandfather    CVA Paternal Grandmother    Heart block Paternal Grandfather        pacemaker   Gestational diabetes Daughter    Celiac disease Daughter        questionable  Colon cancer Neg Hx    Colon polyps Neg Hx    Esophageal cancer Neg Hx    Rectal cancer Neg Hx    Stomach cancer Neg Hx     Social History   Socioeconomic History   Marital status: Married    Spouse name: Not on file   Number of children: Not on file   Years of education: Not on file   Highest education level: Not on file  Occupational History   Not on file  Tobacco Use   Smoking status: Never   Smokeless tobacco: Never  Vaping Use   Vaping status: Never Used  Substance and Sexual Activity   Alcohol use: No   Drug use: No   Sexual activity: Yes    Partners: Male  Other Topics Concern   Not on file  Social History Narrative   Not currently working   Married   2 daughters (youngest still at home)   1 son    Social Drivers of Corporate investment banker Strain: Not on file   Food Insecurity: Not on file  Transportation Needs: Not on file  Physical Activity: Not on file  Stress: Not on file  Social Connections: Not on file  Intimate Partner Violence: Not on file    Outpatient Medications Prior to Visit  Medication Sig Dispense Refill   atorvastatin  (LIPITOR) 20 MG tablet TAKE 1 TABLET BY MOUTH DAILY 30 tablet 0   No facility-administered medications prior to visit.    No Known Allergies  Review of Systems  Constitutional:  Negative for weight loss.  HENT:  Negative for congestion and hearing loss.   Eyes:  Negative for blurred vision.  Respiratory:  Negative for cough.   Musculoskeletal:  Negative for joint pain and myalgias.  Skin:  Negative for rash.       Objective:    Physical Exam   BP 114/60 (BP Location: Right Arm, Patient Position: Sitting, Cuff Size: Small)   Pulse 73   Temp 98.7 F (37.1 C) (Oral)   Resp 16   Ht 5' 7 (1.702 m)   Wt 166 lb (75.3 kg)   SpO2 100%   BMI 26.00 kg/m  Wt Readings from Last 3 Encounters:  04/27/24 166 lb (75.3 kg)  12/09/23 160 lb (72.6 kg)  11/25/23 160 lb (72.6 kg)  Physical Exam  Constitutional: She is oriented to person, place, and time. She appears well-developed and well-nourished. No distress.  HENT:  Head: Normocephalic and atraumatic.  Right Ear: Tympanic membrane and ear canal normal.  Left Ear: Tympanic membrane and ear canal normal.  Mouth/Throat: Oropharynx is clear and moist.  Eyes: Pupils are equal, round, and reactive to light. No scleral icterus.  Neck: Normal range of motion. No thyromegaly present.  Cardiovascular: Normal rate and regular rhythm.   No murmur heard. Pulmonary/Chest: Effort normal and breath sounds normal. No respiratory distress. He has no wheezes. She has no rales. She exhibits no tenderness.  Abdominal: Soft. Bowel sounds are normal. She exhibits no distension and no mass. There is no tenderness. There is no rebound and no guarding.  Musculoskeletal: She  exhibits no edema.  Lymphadenopathy:    She has no cervical adenopathy.  Neurological: She is alert and oriented to person, place, and time. She has normal patellar reflexes. She exhibits normal muscle tone. Coordination normal.  Skin: Skin is warm and dry.  Psychiatric: She has a normal mood and affect. Her behavior is normal. Judgment and thought content  normal.  Breast/Pelvic: deferred         Assessment & Plan:        Assessment & Plan:   Problem List Items Addressed This Visit       Unprioritized   Preventative health care    Generally healthy and active. Up to date on dental exams, Pap smear, and mammogram. Considering bone density test if covered by insurance. Regular screenings and vaccinations important. - Order bone density test - Schedule annual physical in 1 year.      Postmenopausal estrogen deficiency - Primary   Relevant Orders   DG Bone Density   Hyperlipidemia   Cholesterol well-controlled on Lipitor. High cardiovascular risk due to family history and borderline diabetes. Statin benefits outweigh side effects. - Switch to Crestor 10 mg daily due to concern about possible statin induced myalgia (though I doubt based on her description). - Order lipid panel in 3 months.      Relevant Medications   rosuvastatin (CRESTOR) 10 MG tablet   Other Relevant Orders   Lipid panel   Lipid panel   Comp Met (CMET)   Borderline diabetes   Relevant Orders   HgB A1c    I have discontinued [place] Throckmorton W. Edmister's atorvastatin . I am also having her start on rosuvastatin.  Meds ordered this encounter  Medications   rosuvastatin (CRESTOR) 10 MG tablet    Sig: Take 1 tablet (10 mg total) by mouth daily.    Dispense:  90 tablet    Refill:  1    Supervising Provider:   Randie Bustle A [[id number]]

## 2024-04-27 NOTE — Telephone Encounter (Signed)
 Please request copy of last Pap, [facility].

## 2024-04-27 NOTE — Assessment & Plan Note (Signed)
  Generally healthy and active. Up to date on dental exams, Pap smear, and mammogram. Considering bone density test if covered by insurance. Regular screenings and vaccinations important. - Order bone density test - Schedule annual physical in 1 year.

## 2024-04-27 NOTE — Telephone Encounter (Signed)
 Also, request mammo from [facility] please.

## 2024-04-27 NOTE — Assessment & Plan Note (Signed)
 Cholesterol well-controlled on Lipitor. High cardiovascular risk due to family history and borderline diabetes. Statin benefits outweigh side effects. - Switch to Crestor 10 mg daily due to concern about possible statin induced myalgia (though I doubt based on her description). - Order lipid panel in 3 months.

## 2024-04-28 ENCOUNTER — Ambulatory Visit: Payer: Self-pay | Admitting: Family

## 2024-04-28 LAB — LIPID PANEL
Cholesterol: 159 mg/dL (ref 0–200)
HDL: 52.7 mg/dL (ref >39.00)
LDL Cholesterol: 91 mg/dL (ref 0–99)
NonHDL: 106.14
Total CHOL/HDL Ratio: 3
Triglycerides: 74 mg/dL (ref 0.0–149.0)
VLDL: 14.8 mg/dL (ref 0.0–40.0)

## 2024-04-28 LAB — HEMOGLOBIN A1C: Hgb A1c MFr Bld: 6.1 % (ref 4.6–6.5)

## 2024-04-28 LAB — COMPREHENSIVE METABOLIC PANEL WITH GFR
ALT: 15 U/L (ref 0–35)
AST: 20 U/L (ref 0–37)
Albumin: 4.5 g/dL (ref 3.5–5.2)
Alkaline Phosphatase: 59 U/L (ref 39–117)
BUN: 18 mg/dL (ref 6–23)
CO2: 28 meq/L (ref 19–32)
Calcium: 9.4 mg/dL (ref 8.4–10.5)
Chloride: 103 meq/L (ref 96–112)
Creatinine, Ser: 0.67 mg/dL (ref 0.40–1.20)
GFR: 93.38 mL/min (ref >60.00)
Glucose, Bld: 88 mg/dL (ref 70–99)
Potassium: 4.3 meq/L (ref 3.5–5.1)
Sodium: 139 meq/L (ref 135–145)
Total Bilirubin: 0.5 mg/dL (ref 0.2–1.2)
Total Protein: 6.9 g/dL (ref 6.0–8.3)

## 2024-04-28 NOTE — Telephone Encounter (Signed)
 Electronic request sent for each

## 2024-05-07 ENCOUNTER — Telehealth (HOSPITAL_BASED_OUTPATIENT_CLINIC_OR_DEPARTMENT_OTHER): Payer: Self-pay

## 2024-05-18 ENCOUNTER — Other Ambulatory Visit: Payer: Self-pay | Admitting: Family

## 2024-05-18 DIAGNOSIS — E785 Hyperlipidemia, unspecified: Secondary | ICD-10-CM

## 2024-11-17 ENCOUNTER — Telehealth: Payer: Self-pay | Admitting: Family

## 2024-11-17 ENCOUNTER — Other Ambulatory Visit: Payer: Self-pay | Admitting: Family

## 2024-11-17 DIAGNOSIS — E785 Hyperlipidemia, unspecified: Secondary | ICD-10-CM

## 2024-11-17 NOTE — Telephone Encounter (Signed)
 Called patient but no answer, left voice mail for patient to call back. She needs follow up appointment

## 2024-11-17 NOTE — Telephone Encounter (Signed)
 Please contact pt to schedule a follow up visit.

## 2024-11-22 NOTE — Telephone Encounter (Signed)
 Called patient and she scheduled for 12/21/24.  She requested a Tuesday but she may cancel due to keeping her grandbaby, her son is an on call pilot.  She is very concerned about having to cancel but she never knows when she will get that call.

## 2024-12-21 ENCOUNTER — Ambulatory Visit: Admitting: Family
